# Patient Record
Sex: Female | Born: 1965 | Race: White | Hispanic: No | Marital: Married | Smoking: Former smoker
Health system: Southern US, Community
[De-identification: ages and names within clinical notes are randomized; demographics above are authoritative.]

## PROBLEM LIST (undated history)

## (undated) DIAGNOSIS — N871 Moderate cervical dysplasia: Secondary | ICD-10-CM

## (undated) DIAGNOSIS — J301 Allergic rhinitis due to pollen: Secondary | ICD-10-CM

## (undated) DIAGNOSIS — L2089 Other atopic dermatitis: Secondary | ICD-10-CM

## (undated) DIAGNOSIS — M549 Dorsalgia, unspecified: Secondary | ICD-10-CM

## (undated) DIAGNOSIS — M2550 Pain in unspecified joint: Secondary | ICD-10-CM

## (undated) HISTORY — PX: ENDOMETRIAL ABLATION: SHX621

## (undated) HISTORY — PX: LEFT OOPHORECTOMY: SHX1961

## (undated) HISTORY — DX: Moderate cervical dysplasia: N87.1

## (undated) HISTORY — DX: Allergic rhinitis due to pollen: J30.1

## (undated) HISTORY — DX: Other atopic dermatitis: L20.89

## (undated) HISTORY — PX: APPENDECTOMY: SHX54

## (undated) HISTORY — DX: Dorsalgia, unspecified: M54.9

## (undated) HISTORY — PX: OTHER SURGICAL HISTORY: SHX169

## (undated) HISTORY — DX: Pain in unspecified joint: M25.50

---

## 2005-02-28 ENCOUNTER — Ambulatory Visit: Payer: Self-pay | Admitting: Family Medicine

## 2005-02-28 ENCOUNTER — Other Ambulatory Visit: Admission: RE | Admit: 2005-02-28 | Discharge: 2005-02-28 | Payer: Self-pay | Admitting: Family Medicine

## 2005-08-09 ENCOUNTER — Ambulatory Visit: Payer: Self-pay | Admitting: Family Medicine

## 2005-08-26 ENCOUNTER — Ambulatory Visit: Payer: Self-pay | Admitting: Family Medicine

## 2005-12-03 ENCOUNTER — Ambulatory Visit: Payer: Self-pay | Admitting: Family Medicine

## 2005-12-03 DIAGNOSIS — J301 Allergic rhinitis due to pollen: Secondary | ICD-10-CM

## 2005-12-03 HISTORY — DX: Allergic rhinitis due to pollen: J30.1

## 2005-12-04 ENCOUNTER — Telehealth: Payer: Self-pay | Admitting: Family Medicine

## 2006-05-23 ENCOUNTER — Ambulatory Visit: Payer: Self-pay | Admitting: Family Medicine

## 2006-05-23 DIAGNOSIS — L209 Atopic dermatitis, unspecified: Secondary | ICD-10-CM

## 2006-05-23 DIAGNOSIS — L2089 Other atopic dermatitis: Secondary | ICD-10-CM

## 2006-05-23 HISTORY — DX: Other atopic dermatitis: L20.89

## 2006-09-11 ENCOUNTER — Other Ambulatory Visit: Admission: RE | Admit: 2006-09-11 | Discharge: 2006-09-11 | Payer: Self-pay | Admitting: Family Medicine

## 2006-09-11 ENCOUNTER — Ambulatory Visit: Payer: Self-pay | Admitting: Family Medicine

## 2006-09-11 ENCOUNTER — Encounter: Payer: Self-pay | Admitting: Family Medicine

## 2007-01-20 ENCOUNTER — Telehealth: Payer: Self-pay | Admitting: Family Medicine

## 2007-01-23 ENCOUNTER — Encounter: Payer: Self-pay | Admitting: Family Medicine

## 2007-01-23 LAB — CONVERTED CEMR LAB
ALT: <8 units/L (ref 0–35)
CO2: 24 meq/L (ref 19–32)
Calcium: 9.2 mg/dL (ref 8.4–10.5)
Chloride: 106 meq/L (ref 96–112)
Cholesterol: 166 mg/dL (ref 0–200)
Creatinine, Ser: 0.75 mg/dL (ref 0.40–1.20)
Glucose, Bld: 91 mg/dL (ref 70–99)
Total Bilirubin: 0.5 mg/dL (ref 0.3–1.2)
Total CHOL/HDL Ratio: 2.6
Triglycerides: 58 mg/dL (ref <150)

## 2007-01-26 ENCOUNTER — Encounter: Payer: Self-pay | Admitting: Family Medicine

## 2007-02-18 ENCOUNTER — Ambulatory Visit: Payer: Self-pay | Admitting: Obstetrics & Gynecology

## 2007-02-26 ENCOUNTER — Ambulatory Visit: Payer: Self-pay | Admitting: Obstetrics & Gynecology

## 2007-02-26 ENCOUNTER — Encounter: Admission: RE | Admit: 2007-02-26 | Discharge: 2007-02-26 | Payer: Self-pay | Admitting: Obstetrics & Gynecology

## 2007-03-11 ENCOUNTER — Ambulatory Visit: Payer: Self-pay | Admitting: Obstetrics & Gynecology

## 2007-04-10 ENCOUNTER — Ambulatory Visit (HOSPITAL_COMMUNITY): Admission: RE | Admit: 2007-04-10 | Discharge: 2007-04-10 | Payer: Self-pay | Admitting: Obstetrics & Gynecology

## 2007-04-10 ENCOUNTER — Ambulatory Visit: Payer: Self-pay | Admitting: Obstetrics & Gynecology

## 2007-04-10 ENCOUNTER — Encounter: Payer: Self-pay | Admitting: Obstetrics & Gynecology

## 2007-04-22 ENCOUNTER — Ambulatory Visit: Payer: Self-pay | Admitting: Obstetrics & Gynecology

## 2007-04-27 ENCOUNTER — Encounter: Admission: RE | Admit: 2007-04-27 | Discharge: 2007-04-27 | Payer: Self-pay | Admitting: Obstetrics & Gynecology

## 2007-07-17 ENCOUNTER — Ambulatory Visit: Payer: Self-pay | Admitting: Family Medicine

## 2007-07-17 DIAGNOSIS — L259 Unspecified contact dermatitis, unspecified cause: Secondary | ICD-10-CM | POA: Insufficient documentation

## 2007-07-17 DIAGNOSIS — L0100 Impetigo, unspecified: Secondary | ICD-10-CM | POA: Insufficient documentation

## 2007-07-21 ENCOUNTER — Telehealth: Payer: Self-pay | Admitting: Family Medicine

## 2007-09-10 ENCOUNTER — Telehealth: Payer: Self-pay | Admitting: Family Medicine

## 2007-12-09 ENCOUNTER — Telehealth: Payer: Self-pay | Admitting: Family Medicine

## 2008-04-08 ENCOUNTER — Ambulatory Visit: Payer: Self-pay | Admitting: Family Medicine

## 2008-04-27 ENCOUNTER — Ambulatory Visit: Payer: Self-pay | Admitting: Family Medicine

## 2008-04-28 LAB — CONVERTED CEMR LAB
Albumin: 4.4 g/dL (ref 3.5–5.2)
Alkaline Phosphatase: 72 units/L (ref 39–117)
BUN: 12 mg/dL (ref 6–23)
Calcium: 9.2 mg/dL (ref 8.4–10.5)
Chloride: 103 meq/L (ref 96–112)
Creatinine, Ser: 0.66 mg/dL (ref 0.40–1.20)
Glucose, Bld: 85 mg/dL (ref 70–99)
HDL: 57 mg/dL (ref >39)
Potassium: 5 meq/L (ref 3.5–5.3)
Total CHOL/HDL Ratio: 2.5
Triglycerides: 63 mg/dL (ref <150)

## 2009-08-18 ENCOUNTER — Ambulatory Visit: Payer: Self-pay | Admitting: Family Medicine

## 2009-08-20 LAB — CONVERTED CEMR LAB
Albumin: 4.4 g/dL (ref 3.5–5.2)
Alkaline Phosphatase: 74 units/L (ref 39–117)
BUN: 10 mg/dL (ref 6–23)
Creatinine, Ser: 0.62 mg/dL (ref 0.40–1.20)
Glucose, Bld: 88 mg/dL (ref 70–99)
HCT: 42.6 % (ref 36.0–46.0)
HDL: 58 mg/dL (ref >39)
Hemoglobin: 14 g/dL (ref 12.0–15.0)
LDL Cholesterol: 85 mg/dL (ref 0–99)
MCHC: 32.9 g/dL (ref 30.0–36.0)
MCV: 93 fL (ref 78.0–100.0)
RBC: 4.58 M/uL (ref 3.87–5.11)
Total Bilirubin: 0.6 mg/dL (ref 0.3–1.2)
Total CHOL/HDL Ratio: 2.7
Triglycerides: 56 mg/dL (ref <150)
VLDL: 11 mg/dL (ref 0–40)

## 2009-08-30 ENCOUNTER — Encounter: Payer: Self-pay | Admitting: Family Medicine

## 2010-02-22 NOTE — Letter (Signed)
Summary: [facility] Form/[facility]   Imported By: Lanelle Bal 09/12/2009 12:24:08  _____________________________________________________________________  External Attachment:    Type:   Image     Comment:   External Document

## 2010-02-22 NOTE — Assessment & Plan Note (Signed)
Summary: CPE   Vital Signs:  Patient profile:   45 year old female Height:      64.5 inches Weight:      145 pounds BMI:     24.59 Pulse rate:   77 / minute BP sitting:   129 / 83  (left arm) Cuff size:   regular  Vitals Entered By: Avon Gully CMA, Duncan Dull) (August 18, 2009 7:56 AM) CC: CPE, no pap, form for work   Primary Care Provider:  Nani Gasser MD  CC:  CPE, no pap, and form for work.  History of Present Illness: CPE, no pap, form for work.   Current Medications (verified): 1)  Halobetasol Propionate 0.05 % Oint (Halobetasol Propionate) .... Apply Once Daily To Affected Are For No More Than 2 Weeks At A Time.  Allergies (verified): 1)  ! * Lanolin 2)  Singulair (Montelukast Sodium)  Comments:  Nurse/Medical Assistant: The patient's medications and allergies were reviewed with the patient and were updated in the Medication and Allergy Lists. Avon Gully CMA, Duncan Dull) (August 18, 2009 8:04 AM)  Past History:  Past Medical History: Last updated: 04/27/2008 Wears glasses Dr. 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  Associates degree.  Married to [employer].  1 kids at home, 2 are from husbands first marriage (Jorja Loa, Catie, [place]).  Quit smoking 11/94, no EtOH, no drugs, 1-2 caffeinated drinks per day, works out.  Physical Exam  General:  Well-developed,well-nourished,in no acute distress; alert,appropriate and cooperative throughout examination Head:  Normocephalic and atraumatic without obvious abnormalities. No apparent alopecia or balding. Eyes:  No corneal or conjunctival inflammation noted. EOMI. Perrla. Wears glasses.  Sclera are injected.  Left eye turns  inward.  Ears:  External ear exam shows no significant lesions or deformities.  Otoscopic examination reveals clear canals, tympanic membranes are intact bilaterally without bulging, retraction, inflammation or discharge. Hearing is grossly normal bilaterally. Nose:  External nasal examination shows no deformity or inflammation. Nasal mucosa are pink and moist without lesions or exudates. Mouth:  Oral mucosa and oropharynx without lesions or exudates.  Teeth in good repair. Neck:  No deformities, masses, or tenderness noted. Chest Wall:  No deformities, masses, or tenderness noted. Lungs:  Normal respiratory effort, chest expands symmetrically. Lungs are clear to auscultation, no crackles or wheezes. Heart:  Normal rate and regular rhythm. S1 and S2 normal without gallop, murmur, click, rub or other extra sounds. Abdomen:  Bowel sounds positive,abdomen soft and non-tender without masses, organomegaly or hernias noted. Msk:  No deformity or scoliosis noted of thoracic or lumbar spine.   Pulses:  R and L carotid,radial,dorsalis pedis and posterior tibial pulses are full and equal bilaterally Extremities:  No clubbing, cyanosis, edema, or deformity noted with normal full range of motion of all joints.   Neurologic:  No cranial nerve deficits noted. Station and gait are normal.DTRs are symmetrical throughout. Sensory, motor and coordinative functions appear intact. Skin:  no rashes.   Cervical Nodes:  No lymphadenopathy noted Psych:  Cognition and judgment appear intact. Alert and cooperative with normal attention span and concentration. No apparent delusions, illusions, hallucinations   Impression & Recommendations:  Problem # 1:  HEALTHY ADULT FEMALE (ICD-V70.0) Doing well. Exam is normal Encouraged regular exercise.  continuer  her daily calcium.   Due for screening labs.  Has her gyn appt in October and Eye exam in a few weeks.  Orders: T-Comprehensive Metabolic Panel [phone]) T-Lipid  Profile [phone]) T- * Misc. Laboratory test [phone]) T-CBC No Diff ([id number])  Complete Medication List: 1)  Halobetasol Propionate 0.05 % Oint (Halobetasol propionate) .... Apply once daily to affected are for no more than 2 weeks at a time. 2)  Multivitamins Tabs (Multiple vitamin) 3)  Calcium-magnesium 500-250 Mg Tabs (Calcium-magnesium) .... Daily  Patient Instructions: 1)  Please schedule your mammogram . Call [phone]. 2)  It is important that you exercise reguarly at least 20 minutes 5 times a week. If you develop chest pain, have severe difficulty breathing, or feel very tired, stop exercising immediately and seek medical attention.

## 2010-06-05 NOTE — Assessment & Plan Note (Signed)
NAME:  Anna Ray, Anna Ray              ACCOUNT NO.:  [id number]   MEDICAL RECORD NO.:  [id number]          PATIENT TYPE:  POB   LOCATION:  [facility] at [place]         FACILITY:  [facility]   PHYSICIAN:  Elsie Lincoln, MD      DATE OF BIRTH:  1966/01/17   DATE OF SERVICE:                                  CLINIC NOTE   The patient is a 45 year old female who presents for endometrial biopsy  and labial biopsy.  She has had abnormal uterine bleeding and also it  looks like she has a polyp on ultrasound.  The patient also has an  ablated upper portion of the labia minora where her labia minora are  fused at the level of her urethra.  The patient has been consented to  both and her urine pregnancy tests are negative.   PROCEDURE:  1. Endometrial biopsy.  The patient is known to have an anteverted      uterus.  The patient was posed in dorsal lithotomy position and      prepared and draped in normal sterile fashion.  The cervix was      cleaned with Betadine and the anterior lip of the cervix was      expressed equal to tenaculum.  Endometrial biopsy was done and      uterus sounded to 8 cm.  Two passes were taken.  Good tissue was      noted.  Patient tolerated procedure well.  2. Labial biopsy.  The labia was cleaned with Betadine and 3 cc of 1%      Lidocaine were injected into the area.  A small tissue biopsy was      taken with scalpel.  There was good hemostasis with the aid of      silver nitrate.   ASSESSMENT/PLAN:  [age]-year-old female with abnormal uterine bleeding  and labial atrophy plus obliteration.  1. Endometrial biopsy done.  We will review results and call patient.      The patient will need to have her polyp removed too in order to      have the abnormal uterine bleeding stopped.  We will take her to      the operating room for this and do a D&C hysteroscopy with      hydrotherm ablation.  Patient understands the risks of the      procedure are bleeding, infection, damage to  the back of uterus and      burning of the vagina.  2. Labial atrophy.  The patient __________  We will treat her      accordingly.  3. Patient will be scheduled for surgery as above.           ______________________________  Elsie Lincoln, MD     KL/MEDQ  D:  03/11/2007  T:  03/11/2007  Job:  [id number]

## 2010-06-05 NOTE — Assessment & Plan Note (Signed)
NAME:  Anna Ray, Anna Ray              ACCOUNT NO.:  [id number]   MEDICAL RECORD NO.:  [id number]          PATIENT TYPE:  POB   LOCATION:  [facility] at [place]         FACILITY:  [facility]   PHYSICIAN:  Elsie Lincoln, MD      DATE OF BIRTH:  [birth year]   DATE OF SERVICE:  04/22/2007                                  CLINIC NOTE   The patient is a 45 year old female who presents for followup of a D&C  hysteroscopy and endometrial ablation. The patient's pathology came back  as polypoid fragments of benign secretory type endometrium with no  evidence of hyperplasia or malignancy. The patient had an uneventful  endometrial ablation with the [facility] Ablation. She  did have some polypoid fragments at the endocervical-uterine junction.  She has been doing very well since. She is having the normal watery  discharge. She has not been bleeding. She can resume sexual activity.  Her Pap smear is up to date and due in August 2009. She also needs a  mammogram, and we will order that. She had some labial atrophy and used  steroid cream but is no longer having any issues from that. Her biopsy  from the labia was normal. The patient is to come back in August for a  yearly exam.           ______________________________  Elsie Lincoln, MD     KL/MEDQ  D:  04/22/2007  T:  04/22/2007  Job:  [id number]

## 2010-06-05 NOTE — Op Note (Signed)
Anna Ray, Anna Ray              ACCOUNT NO.:  [id number]   MEDICAL RECORD NO.:  [id number]          PATIENT TYPE:  AMB   LOCATION:  [facility]                           FACILITY:  [facility]   PHYSICIAN:  Lesly Dukes, M.D. DATE OF BIRTH:  03-12-1965   DATE OF PROCEDURE:  04/10/2007  DATE OF DISCHARGE:                               OPERATIVE REPORT   PREOPERATIVE DIAGNOSIS:  A 45 year old female with abnormal uterine  bleeding unresponsive to medication.   POSTOPERATIVE DIAGNOSIS:  A 45 year old female with abnormal uterine  bleeding unresponsive to medication.   OPERATIONS:  D and C, hysteroscopy, Hydrotherm ablation.   SURGEON:  Lesly Dukes, M.D.   ANESTHESIA:  General.   FINDINGS:  Uterus with thickened endometrium, especially at the  endocervical uterine junction with some polypoid fleshy material.   SPECIMENS:  Endometrial curettings to pathology.   ESTIMATED BLOOD LOSS:  Minimal.   COMPLICATIONS:  None.   DESCRIPTION OF PROCEDURE:  After informed consent was obtained, the  patient was taken to the operating room where general anesthesia was  induced.  The patient was placed in the dorsal lithotomy position,  prepared and draped in the normal sterile fashion.  The bladder was  emptied with the catheter.  A bivalve speculum was placed into the  vagina and the cervix was brought into view.  The anterior lip of the  cervix was grasped with the single-tooth tenaculum.  The cervical os was  gently dilated with dilators to a #8.  The hysteroscope was introduced  and hysteroscopy was performed with the above findings.  The  hysteroscope was removed and a D and C was performed.  The hysteroscope  was reintroduced and the uterine cavity was filled according to  manufacturer's specifications and a Hydrotherm ablation was completed  for 10 minutes with the appropriate cool down period.  At the end of the  procedure, it appeared all the endometrium was charred down to the  uterine  cervical junction.  There was no bleeding at the end of the  case.  The patient tolerated the procedure well.  Sponge, lap,  instrument and needle count were correct x2 and the patient went to the  recovery room in stable condition.      Lesly Dukes, M.D.  Electronically Signed    KHL/MEDQ  D:  04/10/2007  T:  04/10/2007  Job:  [id number]

## 2010-06-05 NOTE — Assessment & Plan Note (Signed)
NAME:  KAREEM, AUL              ACCOUNT NO.:  [id number]   MEDICAL RECORD NO.:  [id number]          PATIENT TYPE:  POB   LOCATION:  [facility] [place]         FACILITY:  [facility]   PHYSICIAN:  Elsie Lincoln, MD      DATE OF BIRTH:  12/29/1965   DATE OF SERVICE:                                  CLINIC NOTE   Patient is a 45 year old female who is self-referred, worried about  irregular bleeding and menopause.  The patient has had irregular periods  her whole life in terms of consistency but not in timing.  She has a  period every month.  Sometimes it is heavy with clots.  Other times it  is just spotting.  She does have irritability, breast tenderness, and  sometimes headaches associated with all the periods.  She also does have  spotting in between the periods for the past few months prior to  starting birth control pills in August.  She was started on birth  control pills, Loestrin 120, by [facility].  She has  continued to have irregular bleeding with the last episode lasting 01/04  to 01/06 spotting.  She is not having hot flashes or flushes.  She does  have occasional vaginal irritation.   PAST MEDICAL HISTORY:  Eczema.   PAST SURGICAL HISTORY:  1. Laparotomy 16 years ago for a large ovarian cyst that was benign.  2. Appendectomy.   FAMILY HISTORY:  Negative for breast, ovarian, uterine, or colon  cancers.   SOCIAL HISTORY:  Negative for current drinking, drugs, or alcohol but  has a history of all.  Patient does work for [employer].   REVIEW OF SYSTEMS:  Positive for recurrent headaches.  However, she has  not had a headache in the past month-and-a-half.   MEDICATIONS:  Elidel.  Loestrin.   ALLERGIES:  1. LANOLIN.  2. SINGULAIR.   PHYSICAL EXAMINATION:  VITAL SIGNS:  Temperature 98.4.  Pulse 82.  Blood  pressure 107/74.  Weight 141.  Height 63-3/4 inches.  GENERAL:  Well-nourished, well-developed, in no apparent distress.  NEURO:  Alert and  oriented x3.  HEENT:  Normocephalic.  Atraumatic.  RESPIRATORY:  Breathing nonlabored.  ABDOMEN:  Soft.  Nontender.  Nondistended.  No organomegaly.  No hernia.  GENITALIA:  Tanner 5 with what appears to be some agglutination of the  labia minora with some obliteration, questionably lichen sclerosis  superior to the urethra.  Vagina is slightly atrophic.  Uterus grade 1  prolapse, nontender, mobile adnexa.  No ovaries palpated at either side  and no masses.  RECTUM:  No lesions.  EXTREMITIES:  Nontender.  No edema.   ASSESSMENT/PLAN:  45 year old female with irregular bleeding.   PLAN:  1. We need to check a TSH.  She has no nipple discharge so prolactin      not needed.  2. Endometrial biopsy is warranted as she has had an extended period      of irregular bleeding and spotting in between periods.  3. Transvaginal ultrasound.  4. Labial biopsy to rule out lichen sclerosis.  5. I do not think that a follicle-stimulating hormone is warranted at  this time as the patient has not skipped any periods.           ______________________________  Elsie Lincoln, MD     KL/MEDQ  D:  02/18/2007  T:  02/18/2007  Job:  [id number]

## 2010-10-15 LAB — CBC
HCT: 41.9
MCV: 91.4
Platelets: 280
WBC: 7.6

## 2010-12-12 ENCOUNTER — Other Ambulatory Visit: Payer: Self-pay | Admitting: Family Medicine

## 2011-01-08 ENCOUNTER — Encounter: Payer: Self-pay | Admitting: Family Medicine

## 2011-01-11 ENCOUNTER — Encounter: Payer: Self-pay | Admitting: Family Medicine

## 2011-01-11 ENCOUNTER — Ambulatory Visit (INDEPENDENT_AMBULATORY_CARE_PROVIDER_SITE_OTHER): Payer: BC Managed Care – PPO | Admitting: Family Medicine

## 2011-01-11 VITALS — BP 128/85 | HR 84 | Temp 97.7°F | Ht 64.5 in | Wt 140.0 lb

## 2011-01-11 DIAGNOSIS — I8390 Asymptomatic varicose veins of unspecified lower extremity: Secondary | ICD-10-CM

## 2011-01-11 DIAGNOSIS — I868 Varicose veins of other specified sites: Secondary | ICD-10-CM

## 2011-01-11 DIAGNOSIS — Z0000 Encounter for general adult medical examination without abnormal findings: Secondary | ICD-10-CM

## 2011-01-11 DIAGNOSIS — J329 Chronic sinusitis, unspecified: Secondary | ICD-10-CM

## 2011-01-11 DIAGNOSIS — M21619 Bunion of unspecified foot: Secondary | ICD-10-CM

## 2011-01-11 LAB — LIPID PANEL
HDL: 49 mg/dL (ref >39)
LDL Cholesterol: 76 mg/dL (ref 0–99)
Triglycerides: 71 mg/dL (ref <150)

## 2011-01-11 MED ORDER — AMOXICILLIN 875 MG PO TABS: 875.0000 mg | ORAL_TABLET | Freq: Two times a day (BID) | ORAL | Status: AC

## 2011-01-11 NOTE — Patient Instructions (Addendum)
Start a regular exercise program and make sure you are eating a healthy diet Try to eat 4 servings of dairy a day or take a calcium supplement (500mg  twice a day). Your vaccines are up to date.       Sinusitis Sinuses are air pockets within the bones of your face. The growth of bacteria within a sinus leads to infection. The infection prevents the sinuses from draining. This infection is called sinusitis. SYMPTOMS   There will be different areas of pain depending on which sinuses have become infected.  The maxillary sinuses often produce pain beneath the eyes.     Frontal sinusitis may cause pain in the middle of the forehead and above the eyes.  Other problems (symptoms) include:  Toothaches.     Colored, pus-like (purulent) drainage from the nose.     Swelling, warmth, and tenderness over the sinus areas may be signs of infection.  TREATMENT   Sinusitis is most often determined by an exam.X-rays may be taken. If x-rays have been taken, make sure you obtain your results or find out how you are to obtain them. Your caregiver may give you medications (antibiotics). These are medications that will help kill the bacteria causing the infection. You may also be given a medication (decongestant) that helps to reduce sinus swelling.   HOME CARE INSTRUCTIONS    Only take over-the-counter or prescription medicines for pain, discomfort, or fever as directed by your caregiver.     Drink extra fluids. Fluids help thin the mucus so your sinuses can drain more easily.     Applying either moist heat or ice packs to the sinus areas may help relieve discomfort.     Use saline nasal sprays to help moisten your sinuses. The sprays can be found at your local drugstore.  SEEK IMMEDIATE MEDICAL CARE IF:  You have a fever.     You have increasing pain, severe headaches, or toothache.     You have nausea, vomiting, or drowsiness.     You develop unusual swelling around the face or trouble seeing.    MAKE SURE YOU:    Understand these instructions.     Will watch your condition.     Will get help right away if you are not doing well or get worse.  Document Released: 01/07/2005 Document Revised: 09/19/2010 Document Reviewed: 08/06/2006 [facility] Patient Information 2012 [place], [place].

## 2011-01-11 NOTE — Progress Notes (Signed)
Subjective:     Anna Ray is a 45 y.o. female and is here for a comprehensive physical exam. The patient reports problems - 1 week of fever, chills, shakes, sweats, nausea. Never vomited. No diarrhea.  Mild cough, milld ST, sinsus pressure, teeth are hurting.  No nasal congestion ut pressure under her eyes.  Lots of postnasal drip. + HA.  Marland Kitchen  Also c/o of  Ear pain at the top of the right ear over the cartilage. Comes and goes for over a year. Say it will be a very intense sharp pain. No redenss or swelling. No inner ear pain or problen=ns,   History   Social History  . Marital Status: Married    Spouse Name: tim    Number of Children: 1  . Years of Education: N/A   Occupational History  . Not on file.   Social History Main Topics  . Smoking status: Former Smoker    Quit date: 11/21/1992  . Smokeless tobacco: Not on file  . Alcohol Use: No  . Drug Use: No  . Sexually Active: Yes -- Female partner(s)   Other Topics Concern  . Not on file   Social History Narrative   2 cups of caffeine. Regular exercise.     Health Maintenance  Topic Date Due  . Pap Smear  02/10/1985  . Influenza Vaccine  10/22/2011  . Tetanus/tdap  03/21/2012    The following portions of the patient's history were reviewed and updated as appropriate: allergies, current medications, past family history, past medical history, past social history, past surgical history and problem list.  Review of Systems A comprehensive review of systems was negative.   Objective:    BP 128/85  Pulse 84  Temp(Src) 97.7 F (36.5 C) (Oral)  Ht 5' 4.5" (1.638 m)  Wt 140 lb (63.504 kg)  BMI 23.66 kg/m2 General appearance: alert, cooperative and appears stated age Head: Normocephalic, without obvious abnormality, atraumatic Eyes: conj clear, EOMi, PEERLA Ears: normal TM's and external ear canals both ears. Normal external ear exam. No redness, tenderness or swelling. There is some mild eczema at the lower ear crease.    Nose: Nares normal. Septum midline. Mucosa normal. No drainage or sinus tenderness. Throat: lips, mucosa, and tongue normal; teeth and gums normal Neck: no adenopathy, no carotid bruit, no JVD, supple, symmetrical, trachea midline and thyroid not enlarged, symmetric, no tenderness/mass/nodules Back: symmetric, no curvature. ROM normal. No CVA tenderness. Lungs: clear to auscultation bilaterally Heart: regular rate and rhythm, S1, S2 normal, no murmur, click, rub or gallop Abdomen: soft, non-tender; bowel sounds normal; no masses,  no organomegaly Extremities: extremities normal, atraumatic, no cyanosis or edema. She has small bunionette on the lateral foot. No erythema or swelling. No other toe abnormality.  Pulses: 2+ and symmetric Skin: Skin color, texture, turgor normal. No rashes or lesions. She has spider veins on her upper right thigh. No bulging varicose veins.  Lymph nodes: Cervical, supraclavicular, and axillary nodes normal. Neurologic: Alert and oriented X 3, normal strength and tone. Normal symmetric reflexes. Normal coordination and gait    Assessment:    Healthy female exam.    Sinusitis  Bunionette Spider veins   Plan:    See After Visit Summary for Counseling Recommendations  Start a regular exercise program and make sure you are eating a healthy diet Try to eat 4 servings of dairy a day or take a calcium supplement (500mg  twice a day). Your vaccines are up to date.  She has questions about the bunions. They are very painful and even hurt after she takes her shoes off. She also thinks has plantar fascitis. Says her heels often feel tender. Very painful in the AM when first gets out of bed. Says they almost feel swollen but when looks at her foot it looks normal.  Referall to podiatry for bunions.    She has questions about her varicose veins as well.  She does have spider veins on her upper thighs. We discussed wearing compression stocking to help prevent future veins.  For her current veins she say they are not painful or bothersome so removal would be purely cosmetic. She could see a dermatogist who does laser therapy or vein and vascular clinic in [place] or [place].    Sinusitis - will tx with ABX.symptomatic care.  If not better in one week then let me know.

## 2011-01-12 LAB — COMPLETE METABOLIC PANEL WITH GFR
ALT: 11 U/L (ref 0–35)
CO2: 25 mEq/L (ref 19–32)
Creat: 0.6 mg/dL (ref 0.50–1.10)
GFR, Est African American: >89 mL/min
GFR, Est Non African American: >89 mL/min
Total Bilirubin: 0.4 mg/dL (ref 0.3–1.2)

## 2011-07-15 ENCOUNTER — Ambulatory Visit (INDEPENDENT_AMBULATORY_CARE_PROVIDER_SITE_OTHER): Payer: BC Managed Care – PPO | Admitting: Physician Assistant

## 2011-07-15 ENCOUNTER — Encounter: Payer: Self-pay | Admitting: Physician Assistant

## 2011-07-15 VITALS — BP 114/79 | HR 83 | Temp 98.3°F | Ht 63.75 in | Wt 139.0 lb

## 2011-07-15 DIAGNOSIS — M545 Low back pain: Secondary | ICD-10-CM

## 2011-07-15 DIAGNOSIS — L738 Other specified follicular disorders: Secondary | ICD-10-CM

## 2011-07-15 DIAGNOSIS — L739 Follicular disorder, unspecified: Secondary | ICD-10-CM

## 2011-07-15 DIAGNOSIS — R21 Rash and other nonspecific skin eruption: Secondary | ICD-10-CM

## 2011-07-15 MED ORDER — CEPHALEXIN 500 MG PO CAPS: 500.0000 mg | ORAL_CAPSULE | Freq: Four times a day (QID) | ORAL | Status: AC

## 2011-07-15 MED ORDER — CYCLOBENZAPRINE HCL 5 MG PO TABS: 5.0000 mg | ORAL_TABLET | Freq: Every evening | ORAL | Status: DC | PRN

## 2011-07-15 MED ORDER — MUPIROCIN 2 % EX OINT: TOPICAL_OINTMENT | Freq: Three times a day (TID) | CUTANEOUS | Status: AC

## 2011-07-15 MED ORDER — METHYLPREDNISOLONE ACETATE 80 MG/ML IJ SUSP
80.0000 mg | Freq: Once | INTRAMUSCULAR | Status: AC
  Administered 2011-07-15: 80 mg via INTRAMUSCULAR

## 2011-07-15 NOTE — Progress Notes (Signed)
  Subjective:    Patient ID: Anna Ray, female    DOB: March 21, 1965, 46 y.o.   MRN: [id number]  HPI Patient presents today with a rash it start when she went out of town the last week of may. She first noticed the red bump on her chest. She used her Ultravate cream because she assumed it was eczema. He initially went away and then it started spreading all over her torso and down her legs. It itches a lot but no pain. No drainage of lesions. NO fever, chills, myalgias. She did have a staph infection 25 years ago that presented similarly.  Patient just started having some low back pain. The pain does not radiate anywhere. Pt works out a lot and the pain is worse when she is very active. She has not tried anything to make better. She has never had imaging done on back.   Review of Systems     Objective:   Physical Exam  Constitutional: She is oriented to person, place, and time. She appears well-developed and well-nourished.  HENT:  Head: Normocephalic and atraumatic.  Musculoskeletal: Normal range of motion.       Tenderness to palpation on right side of spine in lumbar region. No bony tenderness. Normal ROM at waist.  Neurological: She is alert and oriented to person, place, and time.  Skin:       Red papules closed with no drainage some scabbed over and other look like top has been scratched off. NO vesicles to plaques. Located on torso(front and back), legs, arms, chest.  Psychiatric: She has a normal mood and affect. Her behavior is normal.          Assessment & Plan:  RAsh/Skin eruption/Folliculitis- Gave depo for itching and if there is a contact dermititis component. stop Ultravae. I gave Keflex to treat for bacteria infection. There were no lesion draining that I could culture. I also gave Bactroban to [place] TID for open lesion.   Low back pain- Gave stretches to do. Gave rx for flexeril to use at night. Can use motril for pain control. Call office if not improving.

## 2011-07-15 NOTE — Patient Instructions (Addendum)
Keflex sent to pharmacy to take four times a day for 10 days. Bactroban given to use topically three times a day for 1 week.   Flexeril was given for low back pain to use at needed at night. Also consider anti-inflammatories to help with pain. Stretch well before exercising.  Folliculitis  Folliculitis is an infection and inflammation of the hair follicles. Hair follicles become red and irritated. This inflammation is usually caused by bacteria. The bacteria thrive in warm, moist environments. This condition can be seen anywhere on the body.  CAUSES The most common cause of folliculitis is an infection by germs (bacteria). Fungal and viral infections can also cause the condition. Viral infections may be more common in people whose bodies are unable to fight disease well (weakened immune systems). Examples include people with:  AIDS.   An organ transplant.   Cancer.  People with depressed immune systems, diabetes, or obesity, have a greater risk of getting folliculitis than the general population. Certain chemicals, especially oils and tars, also can cause folliculitis. SYMPTOMS  An early sign of folliculitis is a small, white or yellow pus-filled, itchy lesion (pustule). These lesions appear on a red, inflamed follicle. They are usually less than 5 mm (.20 inches).   The most likely starting points are the scalp, thighs, legs, back and buttocks. Folliculitis is also frequently found in areas of repeated shaving.   When an infection of the follicle goes deeper, it becomes a boil or furuncle. A group of closely packed boils create a larger lesion (a carbuncle). These sores (lesions) tend to occur in hairy, sweaty areas of the body.  TREATMENT   A doctor who specializes in skin problems (dermatologists) treats mild cases of folliculitis with antiseptic washes.   They also use a skin application which kills germs (topical antibiotics). Tea tree oil is a good topical antiseptic as well. It can  be found at a health food store. A small percentage of individuals may develop an allergy to the tea tree oil.   Mild to moderate boils respond well to warm water compresses applied three times daily.   In some cases, oral antibiotics should be taken with the skin treatment.   If lesions contain large quantities of pus or fluid, your caregiver may drain them. This allows the topical antibiotics to get to the affected areas better.   Stubborn cases of folliculitis may respond to laser hair removal. This process uses a high intensity light beam (a laser) to destroy the follicle and reduces the scarring from folliculitis. After laser hair removal, hair will no longer grow in the laser treated area.  Patients with long-lasting folliculitis need to find out where the infection is coming from. Germs can live in the nostrils of the patient. This can trigger an outbreak now and then. Sometimes the bacteria live in the nostrils of a family member. This person does not develop the disorder but they repeatedly re-expose others to the germ. To break the cycle of recurrence in the patient, the family member must also undergo treatment. PREVENTION   Individuals who are predisposed to folliculitis should be extremely careful about personal hygiene.   Application of antiseptic washes may help prevent recurrences.   A topical antibiotic cream, mupirocin (Bactroban), has been effective at reducing bacteria in the nostrils. It is applied inside the nose with your little finger. This is done twice daily for a week. Then it is repeated every 6 months.   Because follicle disorders tend to come  back, patients must receive follow-up care. Your caregiver may be able to recognize a recurrence before it becomes severe.  SEEK IMMEDIATE MEDICAL CARE IF:   You develop redness, swelling, or increasing pain in the area.   You have a fever.   You are not improving with treatment or are getting worse.   You have any other  questions or concerns.  Document Released: 03/18/2001 Document Revised: 12/27/2010 Document Reviewed: 01/13/2008 [facility] Patient Information 2012 [place], [place].

## 2011-10-11 ENCOUNTER — Other Ambulatory Visit: Payer: Self-pay | Admitting: Family Medicine

## 2011-10-11 DIAGNOSIS — Z1231 Encounter for screening mammogram for malignant neoplasm of breast: Secondary | ICD-10-CM

## 2011-10-16 ENCOUNTER — Encounter: Payer: Self-pay | Admitting: Obstetrics & Gynecology

## 2011-10-16 ENCOUNTER — Ambulatory Visit (INDEPENDENT_AMBULATORY_CARE_PROVIDER_SITE_OTHER): Payer: BC Managed Care – PPO | Admitting: Obstetrics & Gynecology

## 2011-10-16 VITALS — BP 102/66 | HR 82 | Temp 97.4°F | Resp 16 | Ht 64.0 in | Wt 136.0 lb

## 2011-10-16 DIAGNOSIS — Z01419 Encounter for gynecological examination (general) (routine) without abnormal findings: Secondary | ICD-10-CM

## 2011-10-16 DIAGNOSIS — N951 Menopausal and female climacteric states: Secondary | ICD-10-CM

## 2011-10-16 DIAGNOSIS — Z124 Encounter for screening for malignant neoplasm of cervix: Secondary | ICD-10-CM

## 2011-10-16 DIAGNOSIS — Z0000 Encounter for general adult medical examination without abnormal findings: Secondary | ICD-10-CM

## 2011-10-16 DIAGNOSIS — Z1151 Encounter for screening for human papillomavirus (HPV): Secondary | ICD-10-CM

## 2011-10-16 NOTE — Patient Instructions (Signed)
Place premenopausal annual exam patient instructions here.  °

## 2011-10-16 NOTE — Progress Notes (Signed)
  Subjective:     Anna Ray is a 46 y.o. female here for a routine exam.  Current complaints: none.  Personal health questionnaire reviewed: yes.  Pt has hot flashes now.  Pt has occasional period but not as regular anymore.  Pt does have premenopausal symptoms before she does bleed.   Gynecologic History Patient's last menstrual period was 09/25/2011. Contraception: vasectomy Last Pap: 2009. Results were: normal Last mammogram: 2009. Results were: normal  Obstetric History OB History    Grav Para Term Preterm Abortions TAB SAB Ect Mult Living   1 1 1             # Outc Date GA Lbr Len/2nd Wgt Sex Del Anes PTL Lv   1 TRM                The following portions of the patient's history were reviewed and updated as appropriate: allergies, current medications, past family history, past medical history, past social history, past surgical history and problem list.  Review of Systems A comprehensive review of systems was negative.    Objective:    Vitals:  WNL General appearance: alert, cooperative and no distress Head: Normocephalic, without obvious abnormality, atraumatic Eyes: negative Throat: lips, mucosa, and tongue normal; teeth and gums normal Lungs: clear to auscultation bilaterally Breasts: normal appearance, no masses or tenderness, No nipple retraction or dimpling, No nipple discharge or bleeding Heart: regular rate and rhythm Abdomen: soft, non-tender; bowel sounds normal; no masses,  no organomegaly Pelvic: cervix normal in appearance, external genitalia normal, no adnexal masses or tenderness, no bladder tenderness, no cervical motion tenderness, perianal skin: no external genital warts noted, rectovaginal septum normal, urethra without abnormality or discharge, uterus normal size, shape, and consistency and vagina normal without discharge Extremities: no edema, redness or tenderness in the calves or thighs Skin: no lesions or rash Lymph nodes: Axillary adenopathy:  none       Assessment:    Healthy female exam.    Plan:    Education reviewed: self breast exams and skin cancer screening. Contraception: vasectomy. Mammogram ordered. Follow up in: 1 year. FSH to see if menopausal Pt to notify us if periods become more irregular or heavy   (Mammogram was ordered by dr. Darra Lis and pt has appointment)

## 2011-10-17 ENCOUNTER — Telehealth: Payer: Self-pay | Admitting: *Deleted

## 2011-10-17 NOTE — Telephone Encounter (Signed)
Message copied by Granville Lewis on Thu Oct 17, 2011 11:00 AM ------      Message from: Lesly Dukes      Created: Thu Oct 17, 2011 10:22 AM       Pt has menopausal level of FSH.  RN to notify pt with results.  This explains hot flashes and irregular cycles.

## 2011-10-17 NOTE — Telephone Encounter (Signed)
Pt notified of FSH levels of being menopausal.  She will contact us if she becomes so symptomatic that she may need assistance.

## 2011-11-19 ENCOUNTER — Ambulatory Visit (INDEPENDENT_AMBULATORY_CARE_PROVIDER_SITE_OTHER): Payer: BC Managed Care – PPO

## 2011-11-19 DIAGNOSIS — Z1231 Encounter for screening mammogram for malignant neoplasm of breast: Secondary | ICD-10-CM

## 2012-02-18 ENCOUNTER — Ambulatory Visit (INDEPENDENT_AMBULATORY_CARE_PROVIDER_SITE_OTHER): Payer: BC Managed Care – PPO | Admitting: Family Medicine

## 2012-02-18 ENCOUNTER — Encounter: Payer: Self-pay | Admitting: Family Medicine

## 2012-02-18 VITALS — BP 113/73 | HR 68 | Temp 98.1°F | Wt 140.0 lb

## 2012-02-18 DIAGNOSIS — J209 Acute bronchitis, unspecified: Secondary | ICD-10-CM

## 2012-02-18 DIAGNOSIS — B9689 Other specified bacterial agents as the cause of diseases classified elsewhere: Secondary | ICD-10-CM

## 2012-02-18 DIAGNOSIS — A499 Bacterial infection, unspecified: Secondary | ICD-10-CM

## 2012-02-18 MED ORDER — AZITHROMYCIN 250 MG PO TABS: ORAL_TABLET | ORAL | Status: AC

## 2012-02-18 MED ORDER — HYDROCODONE-HOMATROPINE 5-1.5 MG/5ML PO SYRP: 5.0000 mL | ORAL_SOLUTION | Freq: Four times a day (QID) | ORAL | Status: DC | PRN

## 2012-02-18 NOTE — Progress Notes (Signed)
CC: Anna Ray is a 47 y.o. female is here for Cough and Sore Throat   Subjective: HPI:  Patient reports cough. Described as nonproductive. Started mid last week getting worse and daily basis. Associated with fevers of 100.3 on a daily basis, response to ibuprofen. Cough is not improved with over-the-counter Robitussin. Symptoms present 24 hours a day and interfering with sleep. Describes overall severity of moderate. Mild shortness of breath when lying down flat but none otherwise. Symptoms improved slightly when sitting up at night. Also reports subjective chills over the weekend. Denies headache, facial pain, nasal congestion, hearing loss, dizziness, confusion, ear pain, joint pain, muscle pain, abdominal pain, nausea, exertional chest pain.   Review Of Systems Outlined In HPI  Past Medical History  Diagnosis Date  . ALLERGIC RHINITIS, SEASONAL 12/03/2005    Qualifier: Diagnosis of  By: Linford Arnold MD, Santina Evans    . DERMATITIS, OTHER ATOPIC 05/23/2006    Qualifier: Diagnosis of  By: Linford Arnold MD, Aurora Mask History  Problem Relation Age of Onset  . Alcohol abuse Other   . Heart attack Father 67  . Alcohol abuse Father   . Hyperlipidemia Father   . Hyperlipidemia Mother   . Hypertension Mother   . Hypertension Father   . Diabetes Maternal Aunt      History  Substance Use Topics  . Smoking status: Former Smoker    Quit date: 11/21/1992  . Smokeless tobacco: Not on file  . Alcohol Use: No     Objective: Filed Vitals:   02/18/12 0909  BP: 113/73  Pulse: 68  Temp: 98.1 F (36.7 C)    General: Alert and Oriented, No Acute Distress HEENT: Pupils equal, round, reactive to light. Conjunctivae clear.  External ears unremarkable, canals clear with intact TMs with appropriate landmarks.  Middle ear appears open without effusion. Pink inferior turbinates.  Moist mucous membranes, pharynx without inflammation nor lesions.  Neck supple without palpable  lymphadenopathy nor abnormal masses. Lungs: Comfortable work of breathing, frequent coughing during exam, no wheezing/rales/consolidations on auscultation. Mild central rhonchi Cardiac: Regular rate and rhythm. Normal S1/S2.  No murmurs, rubs, nor gallops.   Extremities: No peripheral edema.   Mental Status: No depression, anxiety, nor agitation. Skin: Warm and dry.  Assessment & Plan: Anna Ray was seen today for cough and sore throat.  Diagnoses and associated orders for this visit:  Acute bacterial bronchitis - azithromycin (ZITHROMAX) 250 MG tablet; Take two tabs at once on day 1, then one tab daily on days 2-5. - HYDROcodone-homatropine (HYCODAN) 5-1.5 MG/5ML syrup; Take 5 mLs by mouth every 6 (six) hours as needed for cough.    Bacterial bronchitis: Start azithromycin, Hycodan as needed for sleep, continue using Robitussin during the daytime. Focus on staying hydrated and consider humidifier in the room at night.Signs and symptoms requring emergent/urgent reevaluation were discussed with the patient.  Return if symptoms worsen or fail to improve.

## 2012-05-19 ENCOUNTER — Encounter: Payer: Self-pay | Admitting: Obstetrics & Gynecology

## 2012-05-19 ENCOUNTER — Ambulatory Visit (INDEPENDENT_AMBULATORY_CARE_PROVIDER_SITE_OTHER): Payer: BC Managed Care – PPO | Admitting: Obstetrics & Gynecology

## 2012-05-19 VITALS — BP 123/82 | HR 71 | Resp 16 | Ht 63.75 in | Wt 135.0 lb

## 2012-05-19 DIAGNOSIS — N951 Menopausal and female climacteric states: Secondary | ICD-10-CM

## 2012-05-19 DIAGNOSIS — R5383 Other fatigue: Secondary | ICD-10-CM

## 2012-05-19 MED ORDER — CONJ ESTROG-MEDROXYPROGEST ACE 0.625-2.5 MG PO TABS: 1.0000 | ORAL_TABLET | Freq: Every day | ORAL | Status: DC

## 2012-05-19 NOTE — Progress Notes (Signed)
  Subjective:    Patient ID: Anna Ray, female    DOB: 1965-06-06, 47 y.o.   MRN: [id number]  HPI  Pt presents complaining of fatigue, decreased libido for several months.  Pt's FSH is 83 in 2013.   She had a scant period in September and then in Jan 2014.  Pt has had hot flashes, too, but they are not worse than before.  Pt denies depression, problems sleeping, chest pain.  Pt had cardiac work up several years ago.  Pt has no change in eating habits, no weight gain.  Review of Systems  Constitutional: Positive for fatigue. Negative for unexpected weight change.  Eyes: Positive for itching.  Respiratory: Negative.   Cardiovascular: Negative.   Gastrointestinal: Negative.   Endocrine: Negative for cold intolerance.  Genitourinary: Negative for vaginal bleeding and pelvic pain.  Psychiatric/Behavioral: Negative.        Objective:   Physical Exam  Vitals reviewed. Constitutional: She appears well-developed and well-nourished.  Pulmonary/Chest: Effort normal.  Neurological:  Pt appears very tired and was dosing off during conversation.  Skin: Skin is warm and dry.  Psychiatric: She has a normal mood and affect.          Assessment & Plan:  47 yo female with fatigue, decrased libido and hot flashes perimenopausal to menpause  1-TSH, Vit D (PT had recent CBC at work which she states in [place]) 2-Pt to try prempro for symptoms.  If not better, pt should see primary care to rule out sleep apnea or other disorder, atypical presentation for heart disease, or other medical abnormality.   3- Pt aware of increased risk of breast cancer, clots/ PE with HRT

## 2012-05-20 ENCOUNTER — Telehealth: Payer: Self-pay | Admitting: *Deleted

## 2012-05-20 NOTE — Telephone Encounter (Signed)
LM on voicemail that her labs were WNL.

## 2012-06-04 ENCOUNTER — Telehealth: Payer: Self-pay | Admitting: *Deleted

## 2012-06-04 DIAGNOSIS — N951 Menopausal and female climacteric states: Secondary | ICD-10-CM

## 2012-06-04 MED ORDER — ESTRADIOL-NORETHINDRONE ACET 0.05-0.14 MG/DAY TD PTTW: 1.0000 | MEDICATED_PATCH | TRANSDERMAL | Status: DC

## 2012-06-04 NOTE — Telephone Encounter (Signed)
Pt stopped Prempro as she said she had gain 5 lbs in 12 does.  Does not want it and will not take anymore.  She is requesting something else to help with her menopausal symptoms.  Per Dr Penne Lash send in RX for Combipatch BIW to [facility].

## 2012-10-23 ENCOUNTER — Ambulatory Visit (INDEPENDENT_AMBULATORY_CARE_PROVIDER_SITE_OTHER): Payer: BC Managed Care – PPO | Admitting: Family Medicine

## 2012-10-23 ENCOUNTER — Other Ambulatory Visit: Payer: Self-pay | Admitting: Family Medicine

## 2012-10-23 ENCOUNTER — Encounter: Payer: Self-pay | Admitting: Family Medicine

## 2012-10-23 VITALS — BP 108/77 | HR 75 | Wt 142.0 lb

## 2012-10-23 DIAGNOSIS — Z0000 Encounter for general adult medical examination without abnormal findings: Secondary | ICD-10-CM

## 2012-10-23 DIAGNOSIS — Z23 Encounter for immunization: Secondary | ICD-10-CM

## 2012-10-23 DIAGNOSIS — Z131 Encounter for screening for diabetes mellitus: Secondary | ICD-10-CM

## 2012-10-23 LAB — COMPLETE METABOLIC PANEL WITH GFR
Albumin: 4.6 g/dL (ref 3.5–5.2)
CO2: 28 mEq/L (ref 19–32)
Calcium: 9.4 mg/dL (ref 8.4–10.5)
Chloride: 101 mEq/L (ref 96–112)
GFR, Est African American: >89 mL/min
GFR, Est Non African American: >89 mL/min
Glucose, Bld: 80 mg/dL (ref 70–99)
Potassium: 4.3 mEq/L (ref 3.5–5.3)
Sodium: 136 mEq/L (ref 135–145)
Total Protein: 7.4 g/dL (ref 6.0–8.3)

## 2012-10-23 LAB — TSH: TSH: 1.87 u[IU]/mL (ref 0.350–4.500)

## 2012-10-23 NOTE — Progress Notes (Signed)
  Subjective:     Anna Ray is a 47 y.o. female and is here for a comprehensive physical exam. The patient reports no problems.  History   Social History  . Marital Status: Married    Spouse Name: tim    Number of Children: 1  . Years of Education: N/A   Occupational History  . Not on file.   Social History Main Topics  . Smoking status: Former Smoker    Quit date: 11/21/1992  . Smokeless tobacco: Not on file  . Alcohol Use: No  . Drug Use: No  . Sexual Activity: Yes    Partners: Male   Other Topics Concern  . Not on file   Social History Narrative   2 cups of caffeine. Regular exercise.           Health Maintenance  Topic Date Due  . Tetanus/tdap  03/21/2012  . Influenza Vaccine  08/21/2012  . Pap Smear  10/16/2014    The following portions of the patient's history were reviewed and updated as appropriate: allergies, current medications, past family history, past medical history, past social history, past surgical history and problem list.  Review of Systems A comprehensive review of systems was negative.   Objective:    BP 108/77  Pulse 75  Wt 142 lb (64.411 kg)  BMI 24.57 kg/m2  LMP 02/20/2012 General appearance: alert, cooperative and appears stated age Head: Normocephalic, without obvious abnormality, atraumatic Eyes: conj clear, EOMi, PEERLA Ears: normal TM's and external ear canals both ears Nose: Nares normal. Septum midline. Mucosa normal. No drainage or sinus tenderness. Throat: lips, mucosa, and tongue normal; teeth and gums normal Neck: no adenopathy, no carotid bruit, no JVD, supple, symmetrical, trachea midline and thyroid not enlarged, symmetric, no tenderness/mass/nodules Back: symmetric, no curvature. ROM normal. No CVA tenderness. Lungs: clear to auscultation bilaterally Breasts: normal appearance, no masses or tenderness Heart: regular rate and rhythm, S1, S2 normal, no murmur, click, rub or gallop Abdomen: soft, non-tender;  bowel sounds normal; no masses,  no organomegaly Extremities: extremities normal, atraumatic, no cyanosis or edema Pulses: 2+ and symmetric Skin: Skin color, texture, turgor normal. No rashes or lesions Lymph nodes: Cervical, supraclavicular, and axillary nodes normal. Neurologic: Grossly normal    Assessment:    Healthy female exam.      Plan:     See After Visit Summary for Counseling Recommendations  Keep up a regular exercise program and make sure you are eating a healthy diet Try to eat 4 servings of dairy a day, or if you are lactose intolerant take a calcium with vitamin D daily.  Exercises regularly.  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

## 2012-10-24 LAB — VITAMIN D 25 HYDROXY (VIT D DEFICIENCY, FRACTURES): Vit D, 25-Hydroxy: 59 ng/mL (ref 30–89)

## 2012-10-26 NOTE — Progress Notes (Signed)
Quick Note:  All labs are normal. ______ 

## 2012-10-27 ENCOUNTER — Telehealth: Payer: Self-pay | Admitting: *Deleted

## 2012-10-27 NOTE — Telephone Encounter (Signed)
Pt's wellness form faxed.Anna Ray

## 2013-06-29 LAB — LIPID PANEL
CHOLESTEROL: 177 mg/dL (ref 0–200)
HDL: 74 mg/dL — AB (ref 35–70)
LDL Cholesterol: 84 mg/dL
Triglycerides: 94 mg/dL (ref 40–160)

## 2013-06-29 LAB — HM PAP SMEAR

## 2013-06-29 LAB — BASIC METABOLIC PANEL: Glucose: 89 mg/dL

## 2013-06-29 LAB — HEMOGLOBIN A1C: Hgb A1c MFr Bld: 5.6 % (ref 4.0–6.0)

## 2013-10-26 ENCOUNTER — Other Ambulatory Visit: Payer: Self-pay | Admitting: Family Medicine

## 2013-10-26 ENCOUNTER — Ambulatory Visit (INDEPENDENT_AMBULATORY_CARE_PROVIDER_SITE_OTHER): Payer: BC Managed Care – PPO | Admitting: Family Medicine

## 2013-10-26 ENCOUNTER — Encounter: Payer: Self-pay | Admitting: Family Medicine

## 2013-10-26 VITALS — BP 107/65 | HR 80 | Temp 97.9°F | Ht 63.0 in | Wt 146.0 lb

## 2013-10-26 DIAGNOSIS — Z0000 Encounter for general adult medical examination without abnormal findings: Secondary | ICD-10-CM

## 2013-10-26 MED ORDER — HALOBETASOL PROPIONATE 0.05 % EX CREA: TOPICAL_CREAM | Freq: Every day | CUTANEOUS | Status: DC | PRN

## 2013-10-26 NOTE — Patient Instructions (Signed)
Keep up a regular exercise program and make sure you are eating a healthy diet Try to eat 4 servings of dairy a day, or if you are lactose intolerant take a calcium with vitamin D daily.  Your vaccines are up to date.   

## 2013-10-26 NOTE — Progress Notes (Signed)
  Subjective:     Anna Ray is a 48 y.o. female and is here for a comprehensive physical exam. The patient reports no problems.  She is still having difficulty losing weight. Works out regularly. Sees GYN for her breast and pap exam.    History   Social History  . Marital Status: Married    Spouse Name: tim    Number of Children: 1  . Years of Education: N/A   Occupational History  . Not on file.   Social History Main Topics  . Smoking status: Former Smoker    Quit date: 11/21/1992  . Smokeless tobacco: Not on file  . Alcohol Use: No  . Drug Use: No  . Sexual Activity: Yes    Partners: Male   Other Topics Concern  . Not on file   Social History Narrative   2 cups of caffeine. Regular exercise.           Health Maintenance  Topic Date Due  . Influenza Vaccine  10/27/2014 (Originally 08/21/2013)  . Pap Smear  10/16/2014  . Tetanus/tdap  10/24/2022    The following portions of the patient's history were reviewed and updated as appropriate: allergies, current medications, past family history, past medical history, past social history, past surgical history and problem list.  Review of Systems A comprehensive review of systems was negative.   Objective:    BP 107/65  Pulse 80  Temp(Src) 97.9 F (36.6 C)  Ht 5\' 3"  (1.6 m)  Wt 146 lb (66.225 kg)  BMI 25.87 kg/m2  LMP 02/20/2012 General appearance: alert, cooperative and appears stated age Head: Normocephalic, without obvious abnormality, atraumatic Eyes: conj clear, EOMI, PEERLA Ears: normal TM's and external ear canals both ears Nose: Nares normal. Septum midline. Mucosa normal. No drainage or sinus tenderness. Throat: lips, mucosa, and tongue normal; teeth and gums normal Neck: no adenopathy, no carotid bruit, no JVD, supple, symmetrical, trachea midline and thyroid not enlarged, symmetric, no tenderness/mass/nodules Back: symmetric, no curvature. ROM normal. No CVA tenderness. Lungs: clear to  auscultation bilaterally Heart: regular rate and rhythm, S1, S2 normal, no murmur, click, rub or gallop Abdomen: soft, non-tender; bowel sounds normal; no masses,  no organomegaly Extremities: extremities normal, atraumatic, no cyanosis or edema Pulses: 2+ and symmetric Skin: Skin color, texture, turgor normal. No rashes or lesions Lymph nodes: Cervical, supraclavicular, and axillary nodes normal. Neurologic: Alert and oriented X 3, normal strength and tone. Normal symmetric reflexes. Normal coordination and gait    Assessment:    Healthy female exam.      Plan:     See After Visit Summary for Counseling Recommendations  Keep up a regular exercise program and make sure you are eating a healthy diet Try to eat 4 servings of dairy a day, or if you are lactose intolerant take a calcium with vitamin D daily.  Your vaccines are up to date.  Asked her to send us a copy of her bloodwork form work, She is getting her flu shot through work.

## 2013-11-22 ENCOUNTER — Encounter: Payer: Self-pay | Admitting: Family Medicine

## 2013-11-25 ENCOUNTER — Other Ambulatory Visit: Payer: Self-pay | Admitting: *Deleted

## 2013-11-25 ENCOUNTER — Telehealth: Payer: Self-pay | Admitting: *Deleted

## 2013-11-25 DIAGNOSIS — L209 Atopic dermatitis, unspecified: Secondary | ICD-10-CM

## 2013-11-25 MED ORDER — HALOBETASOL PROPIONATE 0.05 % EX OINT: TOPICAL_OINTMENT | CUTANEOUS | Status: DC

## 2013-11-25 MED ORDER — HALOBETASOL PROPIONATE 0.05 % EX CREA
TOPICAL_CREAM | Freq: Every day | CUTANEOUS | Status: DC | PRN
Start: 2013-11-25 — End: 2013-11-25

## 2013-12-04 ENCOUNTER — Encounter: Payer: Self-pay | Admitting: Family Medicine

## 2013-12-07 NOTE — Telephone Encounter (Signed)
Error

## 2014-01-07 ENCOUNTER — Encounter: Payer: Self-pay | Admitting: Physician Assistant

## 2014-01-07 ENCOUNTER — Ambulatory Visit (INDEPENDENT_AMBULATORY_CARE_PROVIDER_SITE_OTHER): Payer: BC Managed Care – PPO | Admitting: Physician Assistant

## 2014-01-07 VITALS — BP 120/69 | HR 84 | Temp 97.8°F | Ht 64.0 in | Wt 148.0 lb

## 2014-01-07 DIAGNOSIS — M545 Low back pain, unspecified: Secondary | ICD-10-CM

## 2014-01-07 LAB — POCT URINALYSIS DIPSTICK
Bilirubin, UA: NEGATIVE
Blood, UA: NEGATIVE
GLUCOSE UA: NEGATIVE
KETONES UA: NEGATIVE
LEUKOCYTES UA: NEGATIVE
Nitrite, UA: NEGATIVE
Protein, UA: NEGATIVE
SPEC GRAV UA: 1.01
Urobilinogen, UA: 0.2
pH, UA: 7

## 2014-01-07 MED ORDER — TRAMADOL HCL 50 MG PO TABS: 50.0000 mg | ORAL_TABLET | Freq: Three times a day (TID) | ORAL | Status: DC | PRN

## 2014-01-07 NOTE — Patient Instructions (Signed)
OTC advil up to 800mg  up to three times.   Low Back Strain with Rehab A strain is an injury in which a tendon or muscle is torn. The muscles and tendons of the lower back are vulnerable to strains. However, these muscles and tendons are very strong and require a great force to be injured. Strains are classified into three categories. Grade 1 strains cause pain, but the tendon is not lengthened. Grade 2 strains include a lengthened ligament, due to the ligament being stretched or partially ruptured. With grade 2 strains there is still function, although the function may be decreased. Grade 3 strains involve a complete tear of the tendon or muscle, and function is usually impaired. SYMPTOMS   Pain in the lower back.  Pain that affects one side more than the other.  Pain that gets worse with movement and may be felt in the hip, buttocks, or back of the thigh.  Muscle spasms of the muscles in the back.  Swelling along the muscles of the back.  Loss of strength of the back muscles.  Crackling sound (crepitation) when the muscles are touched. CAUSES  Lower back strains occur when a force is placed on the muscles or tendons that is greater than they can handle. Common causes of injury include:  Prolonged overuse of the muscle-tendon units in the lower back, usually from incorrect posture.  A single violent injury or force applied to the back. RISK INCREASES WITH:  Sports that involve twisting forces on the spine or a lot of bending at the waist (football, rugby, weightlifting, bowling, golf, tennis, speed skating, racquetball, swimming, running, gymnastics, diving).  Poor strength and flexibility.  Failure to warm up properly before activity.  Family history of lower back pain or disk disorders.  Previous back injury or surgery (especially fusion).  Poor posture with lifting, especially heavy objects.  Prolonged sitting, especially with poor posture. PREVENTION   Learn and use  proper posture when sitting or lifting (maintain proper posture when sitting, lift using the knees and legs, not at the waist).  Warm up and stretch properly before activity.  Allow for adequate recovery between workouts.  Maintain physical fitness:  Strength, flexibility, and endurance.  Cardiovascular fitness. PROGNOSIS  If treated properly, lower back strains usually heal within 6 weeks. RELATED COMPLICATIONS   Recurring symptoms, resulting in a chronic problem.  Chronic inflammation, scarring, and partial muscle-tendon tear.  Delayed healing or resolution of symptoms.  Prolonged disability. TREATMENT  Treatment first involves the use of ice and medicine, to reduce pain and inflammation. The use of strengthening and stretching exercises may help reduce pain with activity. These exercises may be performed at home or with a therapist. Severe injuries may require referral to a therapist for further evaluation and treatment, such as ultrasound. Your caregiver may advise that you wear a back brace or corset, to help reduce pain and discomfort. Often, prolonged bed rest results in greater harm then benefit. Corticosteroid injections may be recommended. However, these should be reserved for the most serious cases. It is important to avoid using your back when lifting objects. At night, sleep on your back on a firm mattress with a pillow placed under your knees. If non-surgical treatment is unsuccessful, surgery may be needed.  MEDICATION   If pain medicine is needed, nonsteroidal anti-inflammatory medicines (aspirin and ibuprofen), or other minor pain relievers (acetaminophen), are often advised.  Do not take pain medicine for 7 days before surgery.  Prescription pain relievers may be  given, if your caregiver thinks they are needed. Use only as directed and only as much as you need.  Ointments applied to the skin may be helpful.  Corticosteroid injections may be given by your caregiver.  These injections should be reserved for the most serious cases, because they may only be given a certain number of times. HEAT AND COLD  Cold treatment (icing) should be applied for 10 to 15 minutes every 2 to 3 hours for inflammation and pain, and immediately after activity that aggravates your symptoms. Use ice packs or an ice massage.  Heat treatment may be used before performing stretching and strengthening activities prescribed by your caregiver, physical therapist, or athletic trainer. Use a heat pack or a warm water soak. SEEK MEDICAL CARE IF:   Symptoms get worse or do not improve in 2 to 4 weeks, despite treatment.  You develop numbness, weakness, or loss of bowel or bladder function.  New, unexplained symptoms develop. (Drugs used in treatment may produce side effects.) EXERCISES  RANGE OF MOTION (ROM) AND STRETCHING EXERCISES - Low Back Strain Most people with lower back pain will find that their symptoms get worse with excessive bending forward (flexion) or arching at the lower back (extension). The exercises which will help resolve your symptoms will focus on the opposite motion.  Your physician, physical therapist or athletic trainer will help you determine which exercises will be most helpful to resolve your lower back pain. Do not complete any exercises without first consulting with your caregiver. Discontinue any exercises which make your symptoms worse until you speak to your caregiver.  If you have pain, numbness or tingling which travels down into your buttocks, leg or foot, the goal of the therapy is for these symptoms to move closer to your back and eventually resolve. Sometimes, these leg symptoms will get better, but your lower back pain may worsen. This is typically an indication of progress in your rehabilitation. Be very alert to any changes in your symptoms and the activities in which you participated in the 24 hours prior to the change. Sharing this information with your  caregiver will allow him/her to most efficiently treat your condition.  These exercises may help you when beginning to rehabilitate your injury. Your symptoms may resolve with or without further involvement from your physician, physical therapist or athletic trainer. While completing these exercises, remember:  Restoring tissue flexibility helps normal motion to return to the joints. This allows healthier, less painful movement and activity.  An effective stretch should be held for at least 30 seconds.  A stretch should never be painful. You should only feel a gentle lengthening or release in the stretched tissue. FLEXION RANGE OF MOTION AND STRETCHING EXERCISES: STRETCH - Flexion, Single Knee to Chest   Lie on a firm bed or floor with both legs extended in front of you.  Keeping one leg in contact with the floor, bring your opposite knee to your chest. Hold your leg in place by either grabbing behind your thigh or at your knee.  Pull until you feel a gentle stretch in your lower back. Hold __________ seconds.  Slowly release your grasp and repeat the exercise with the opposite side. Repeat __________ times. Complete this exercise __________ times per day.  STRETCH - Flexion, Double Knee to Chest   Lie on a firm bed or floor with both legs extended in front of you.  Keeping one leg in contact with the floor, bring your opposite knee to your chest.  Tense your stomach muscles to support your back and then lift your other knee to your chest. Hold your legs in place by either grabbing behind your thighs or at your knees.  Pull both knees toward your chest until you feel a gentle stretch in your lower back. Hold __________ seconds.  Tense your stomach muscles and slowly return one leg at a time to the floor. Repeat __________ times. Complete this exercise __________ times per day.  STRETCH - Low Trunk Rotation  Lie on a firm bed or floor. Keeping your legs in front of you, bend your  knees so they are both pointed toward the ceiling and your feet are flat on the floor.  Extend your arms out to the side. This will stabilize your upper body by keeping your shoulders in contact with the floor.  Gently and slowly drop both knees together to one side until you feel a gentle stretch in your lower back. Hold for __________ seconds.  Tense your stomach muscles to support your lower back as you bring your knees back to the starting position. Repeat the exercise to the other side. Repeat __________ times. Complete this exercise __________ times per day  EXTENSION RANGE OF MOTION AND FLEXIBILITY EXERCISES: STRETCH - Extension, Prone on Elbows   Lie on your stomach on the floor, a bed will be too soft. Place your palms about shoulder width apart and at the height of your head.  Place your elbows under your shoulders. If this is too painful, stack pillows under your chest.  Allow your body to relax so that your hips drop lower and make contact more completely with the floor.  Hold this position for __________ seconds.  Slowly return to lying flat on the floor. Repeat __________ times. Complete this exercise __________ times per day.  RANGE OF MOTION - Extension, Prone Press Ups  Lie on your stomach on the floor, a bed will be too soft. Place your palms about shoulder width apart and at the height of your head.  Keeping your back as relaxed as possible, slowly straighten your elbows while keeping your hips on the floor. You may adjust the placement of your hands to maximize your comfort. As you gain motion, your hands will come more underneath your shoulders.  Hold this position __________ seconds.  Slowly return to lying flat on the floor. Repeat __________ times. Complete this exercise __________ times per day.  RANGE OF MOTION- Quadruped, Neutral Spine   Assume a hands and knees position on a firm surface. Keep your hands under your shoulders and your knees under your hips.  You may place padding under your knees for comfort.  Drop your head and point your tail bone toward the ground below you. This will round out your lower back like an angry cat. Hold this position for __________ seconds.  Slowly lift your head and release your tail bone so that your back sags into a large arch, like an old horse.  Hold this position for __________ seconds.  Repeat this until you feel limber in your lower back.  Now, find your "sweet spot." This will be the most comfortable position somewhere between the two previous positions. This is your neutral spine. Once you have found this position, tense your stomach muscles to support your lower back.  Hold this position for __________ seconds. Repeat __________ times. Complete this exercise __________ times per day.  STRENGTHENING EXERCISES - Low Back Strain These exercises may help you when beginning to rehabilitate  your injury. These exercises should be done near your "sweet spot." This is the neutral, low-back arch, somewhere between fully rounded and fully arched, that is your least painful position. When performed in this safe range of motion, these exercises can be used for people who have either a flexion or extension based injury. These exercises may resolve your symptoms with or without further involvement from your physician, physical therapist or athletic trainer. While completing these exercises, remember:   Muscles can gain both the endurance and the strength needed for everyday activities through controlled exercises.  Complete these exercises as instructed by your physician, physical therapist or athletic trainer. Increase the resistance and repetitions only as guided.  You may experience muscle soreness or fatigue, but the pain or discomfort you are trying to eliminate should never worsen during these exercises. If this pain does worsen, stop and make certain you are following the directions exactly. If the pain is still  present after adjustments, discontinue the exercise until you can discuss the trouble with your caregiver. STRENGTHENING - Deep Abdominals, Pelvic Tilt  Lie on a firm bed or floor. Keeping your legs in front of you, bend your knees so they are both pointed toward the ceiling and your feet are flat on the floor.  Tense your lower abdominal muscles to press your lower back into the floor. This motion will rotate your pelvis so that your tail bone is scooping upwards rather than pointing at your feet or into the floor.  With a gentle tension and even breathing, hold this position for __________ seconds. Repeat __________ times. Complete this exercise __________ times per day.  STRENGTHENING - Abdominals, Crunches   Lie on a firm bed or floor. Keeping your legs in front of you, bend your knees so they are both pointed toward the ceiling and your feet are flat on the floor. Cross your arms over your chest.  Slightly tip your chin down without bending your neck.  Tense your abdominals and slowly lift your trunk high enough to just clear your shoulder blades. Lifting higher can put excessive stress on the lower back and does not further strengthen your abdominal muscles.  Control your return to the starting position. Repeat __________ times. Complete this exercise __________ times per day.  STRENGTHENING - Quadruped, Opposite UE/LE Lift   Assume a hands and knees position on a firm surface. Keep your hands under your shoulders and your knees under your hips. You may place padding under your knees for comfort.  Find your neutral spine and gently tense your abdominal muscles so that you can maintain this position. Your shoulders and hips should form a rectangle that is parallel with the floor and is not twisted.  Keeping your trunk steady, lift your right hand no higher than your shoulder and then your left leg no higher than your hip. Make sure you are not holding your breath. Hold this position  __________ seconds.  Continuing to keep your abdominal muscles tense and your back steady, slowly return to your starting position. Repeat with the opposite arm and leg. Repeat __________ times. Complete this exercise __________ times per day.  STRENGTHENING - Lower Abdominals, Double Knee Lift  Lie on a firm bed or floor. Keeping your legs in front of you, bend your knees so they are both pointed toward the ceiling and your feet are flat on the floor.  Tense your abdominal muscles to brace your lower back and slowly lift both of your knees until they come  over your hips. Be certain not to hold your breath.  Hold __________ seconds. Using your abdominal muscles, return to the starting position in a slow and controlled manner. Repeat __________ times. Complete this exercise __________ times per day.  POSTURE AND BODY MECHANICS CONSIDERATIONS - Low Back Strain Keeping correct posture when sitting, standing or completing your activities will reduce the stress put on different body tissues, allowing injured tissues a chance to heal and limiting painful experiences. The following are general guidelines for improved posture. Your physician or physical therapist will provide you with any instructions specific to your needs. While reading these guidelines, remember:  The exercises prescribed by your provider will help you have the flexibility and strength to maintain correct postures.  The correct posture provides the best environment for your joints to work. All of your joints have less wear and tear when properly supported by a spine with good posture. This means you will experience a healthier, less painful body.  Correct posture must be practiced with all of your activities, especially prolonged sitting and standing. Correct posture is as important when doing repetitive low-stress activities (typing) as it is when doing a single heavy-load activity (lifting). RESTING POSITIONS Consider which  positions are most painful for you when choosing a resting position. If you have pain with flexion-based activities (sitting, bending, stooping, squatting), choose a position that allows you to rest in a less flexed posture. You would want to avoid curling into a fetal position on your side. If your pain worsens with extension-based activities (prolonged standing, working overhead), avoid resting in an extended position such as sleeping on your stomach. Most people will find more comfort when they rest with their spine in a more neutral position, neither too rounded nor too arched. Lying on a non-sagging bed on your side with a pillow between your knees, or on your back with a pillow under your knees will often provide some relief. Keep in mind, being in any one position for a prolonged period of time, no matter how correct your posture, can still lead to stiffness. PROPER SITTING POSTURE In order to minimize stress and discomfort on your spine, you must sit with correct posture. Sitting with good posture should be effortless for a healthy body. Returning to good posture is a gradual process. Many people can work toward this most comfortably by using various supports until they have the flexibility and strength to maintain this posture on their own. When sitting with proper posture, your ears will fall over your shoulders and your shoulders will fall over your hips. You should use the back of the chair to support your upper back. Your lower back will be in a neutral position, just slightly arched. You may place a small pillow or folded towel at the base of your lower back for support.  When working at a desk, create an environment that supports good, upright posture. Without extra support, muscles tire, which leads to excessive strain on joints and other tissues. Keep these recommendations in mind: CHAIR:  A chair should be able to slide under your desk when your back makes contact with the back of the chair.  This allows you to work closely.  The chair's height should allow your eyes to be level with the upper part of your monitor and your hands to be slightly lower than your elbows. BODY POSITION  Your feet should make contact with the floor. If this is not possible, use a foot rest.  Keep your ears  over your shoulders. This will reduce stress on your neck and lower back. INCORRECT SITTING POSTURES  If you are feeling tired and unable to assume a healthy sitting posture, do not slouch or slump. This puts excessive strain on your back tissues, causing more damage and pain. Healthier options include:  Using more support, like a lumbar pillow.  Switching tasks to something that requires you to be upright or walking.  Talking a brief walk.  Lying down to rest in a neutral-spine position. PROLONGED STANDING WHILE SLIGHTLY LEANING FORWARD  When completing a task that requires you to lean forward while standing in one place for a long time, place either foot up on a stationary 2-4 inch high object to help maintain the best posture. When both feet are on the ground, the lower back tends to lose its slight inward curve. If this curve flattens (or becomes too large), then the back and your other joints will experience too much stress, tire more quickly, and can cause pain. CORRECT STANDING POSTURES Proper standing posture should be assumed with all daily activities, even if they only take a few moments, like when brushing your teeth. As in sitting, your ears should fall over your shoulders and your shoulders should fall over your hips. You should keep a slight tension in your abdominal muscles to brace your spine. Your tailbone should point down to the ground, not behind your body, resulting in an over-extended swayback posture.  INCORRECT STANDING POSTURES  Common incorrect standing postures include a forward head, locked knees and/or an excessive swayback. WALKING Walk with an upright posture. Your  ears, shoulders and hips should all line-up. PROLONGED ACTIVITY IN A FLEXED POSITION When completing a task that requires you to bend forward at your waist or lean over a low surface, try to find a way to stabilize 3 out of 4 of your limbs. You can place a hand or elbow on your thigh or rest a knee on the surface you are reaching across. This will provide you more stability so that your muscles do not fatigue as quickly. By keeping your knees relaxed, or slightly bent, you will also reduce stress across your lower back. CORRECT LIFTING TECHNIQUES DO :   Assume a wide stance. This will provide you more stability and the opportunity to get as close as possible to the object which you are lifting.  Tense your abdominals to brace your spine. Bend at the knees and hips. Keeping your back locked in a neutral-spine position, lift using your leg muscles. Lift with your legs, keeping your back straight.  Test the weight of unknown objects before attempting to lift them.  Try to keep your elbows locked down at your sides in order get the best strength from your shoulders when carrying an object.  Always ask for help when lifting heavy or awkward objects. INCORRECT LIFTING TECHNIQUES DO NOT:   Lock your knees when lifting, even if it is a small object.  Bend and twist. Pivot at your feet or move your feet when needing to change directions.  Assume that you can safely pick up even a paper clip without proper posture. Document Released: 01/07/2005 Document Revised: 04/01/2011 Document Reviewed: 04/21/2008 [facility] Patient Information 2015 [place], [place]. This information is not intended to replace advice given to you by your health care provider. Make sure you discuss any questions you have with your health care provider.

## 2014-01-07 NOTE — Progress Notes (Signed)
   Subjective:    Patient ID: Anna Ray, female    DOB: 04/28/1965, 48 y.o.   MRN: [id number]  HPI  Pt presents to the clinic with low back pain. Last week she was sitting all week for training and not going to the gym. She was having lower back pain. Saturday she had episode excuriating 8/10 pain that radiated through lower back but not into legs. Had a few bouts of loose stools. Right now pain is much better 1/10. Started back exercising this week. No bowel or bladder dysfunction. No saddle anthesthia. No increase urination, dysuria or changes in frequency.    Review of Systems  All other systems reviewed and are negative.      Objective:   Physical Exam  Constitutional: She is oriented to person, place, and time. She appears well-developed and well-nourished.  HENT:  Head: Normocephalic and atraumatic.  Cardiovascular: Normal rate, regular rhythm and normal heart sounds.   Pulmonary/Chest: Effort normal and breath sounds normal. She has no wheezes.  Musculoskeletal:  No pain to palpation over spine.  Normal ROM at waist without pain.  No tenderness but tightness over lumbar paraspinous muscles.  Negative straight leg test. NROM at hips.  Strength of bilateral legs 5/5.   Neurological: She is alert and oriented to person, place, and time.  Skin: Skin is dry.  Psychiatric: She has a normal mood and affect. Her behavior is normal.          Assessment & Plan:  Midline low back pain without scaitica- .Marland Kitchen. Results for orders placed or performed in visit on 01/07/14  Urine Culture  Result Value Ref Range   Colony Count 9,000 COLONIES/ML    Organism ID, Bacteria Insignificant Growth   POCT urinalysis dipstick  Result Value Ref Range   Color, UA yellow    Clarity, UA clear    Glucose, UA neg    Bilirubin, UA neg    Ketones, UA neg    Spec Grav, UA 1.010    Blood, UA neg    pH, UA 7.0    Protein, UA neg    Urobilinogen, UA 0.2    Nitrite, UA neg    Leukocytes, UA  Negative    No signs of infection.   Tramadol as needed for pain. Ibuprofen 800mg  up to three times a day as needed for next 3-5 days. Warm compresses. Low back stretches given. Offered muscle relaxer and pt declined.  Follow up if not improving. No red flags will hold off on imaging.

## 2014-01-09 LAB — URINE CULTURE

## 2014-06-21 ENCOUNTER — Ambulatory Visit: Payer: Self-pay | Admitting: Obstetrics & Gynecology

## 2014-06-30 ENCOUNTER — Ambulatory Visit: Payer: Self-pay | Admitting: Obstetrics & Gynecology

## 2014-08-03 ENCOUNTER — Encounter: Payer: Self-pay | Admitting: Family Medicine

## 2014-10-28 ENCOUNTER — Encounter: Payer: Self-pay | Admitting: Family Medicine

## 2014-11-08 ENCOUNTER — Encounter: Payer: Self-pay | Admitting: Family Medicine

## 2014-11-08 ENCOUNTER — Ambulatory Visit (INDEPENDENT_AMBULATORY_CARE_PROVIDER_SITE_OTHER): Payer: BLUE CROSS/BLUE SHIELD | Admitting: Family Medicine

## 2014-11-08 VITALS — BP 115/47 | HR 75 | Temp 98.2°F | Resp 16 | Ht 64.0 in | Wt 156.3 lb

## 2014-11-08 DIAGNOSIS — Z1239 Encounter for other screening for malignant neoplasm of breast: Secondary | ICD-10-CM

## 2014-11-08 DIAGNOSIS — Z0000 Encounter for general adult medical examination without abnormal findings: Secondary | ICD-10-CM | POA: Diagnosis not present

## 2014-11-08 NOTE — Progress Notes (Signed)
  Subjective:     Anna Ray is a 49 y.o. female and is here for a comprehensive physical exam. The patient reports no problems.  Social History   Social History  . Marital Status: Married    Spouse Name: tim  . Number of Children: 1  . Years of Education: N/A   Occupational History  . Not on file.   Social History Main Topics  . Smoking status: Former Smoker    Quit date: 11/21/1992  . Smokeless tobacco: Not on file  . Alcohol Use: No  . Drug Use: No  . Sexual Activity:    Partners: Male   Other Topics Concern  . Not on file   Social History Narrative   2 cups of caffeine. Regular exercise.           Health Maintenance  Topic Date Due  . INFLUENZA VACCINE  11/08/2015 (Originally 08/22/2014)  . HIV Screening  11/08/2015 (Originally [birth year]10/1982)  . PAP SMEAR  06/29/2016  . TETANUS/TDAP  10/24/2022    The following portions of the patient's history were reviewed and updated as appropriate: allergies, current medications, past family history, past medical history, past social history, past surgical history and problem list.  Review of Systems Pertinent items noted in HPI and remainder of comprehensive ROS otherwise negative.   Objective:    BP 115/47 mmHg  Pulse 75  Temp(Src) 98.2 F (36.8 C) (Oral)  Resp 16  Ht 5\' 4"  (1.626 m)  Wt 156 lb 4.8 oz (70.897 kg)  BMI 26.82 kg/m2  SpO2 98%  LMP 02/20/2012 General appearance: alert, cooperative and appears stated age Head: Normocephalic, without obvious abnormality, atraumatic Eyes: conj clear, EOMI, PEERLA Ears: normal TM's and external ear canals both ears Nose: Nares normal. Septum midline. Mucosa normal. No drainage or sinus tenderness. Throat: lips, mucosa, and tongue normal; teeth and gums normal Neck: no adenopathy, no carotid bruit, no JVD, supple, symmetrical, trachea midline and thyroid not enlarged, symmetric, no tenderness/mass/nodules Back: symmetric, no curvature. ROM normal. No CVA  tenderness. Lungs: clear to auscultation bilaterally Breasts: normal appearance, no masses or tenderness Heart: regular rate and rhythm, S1, S2 normal, no murmur, click, rub or gallop Abdomen: soft, non-tender; bowel sounds normal; no masses,  no organomegaly Extremities: extremities normal, atraumatic, no cyanosis or edema Pulses: 2+ and symmetric Skin: Skin color, texture, turgor normal. No rashes or lesions Lymph nodes: Cervical, supraclavicular, and axillary nodes normal. Neurologic: Alert and oriented X 3, normal strength and tone. Normal symmetric reflexes. Normal coordination and gait    Assessment:    Healthy female exam.      Plan:     See After Visit Summary for Counseling Recommendations  Keep up a regular exercise program and make sure you are eating a healthy diet Try to eat 4 servings of dairy a day, or if you are lactose intolerant take a calcium with vitamin D daily.  Your vaccines are up to date.

## 2014-11-08 NOTE — Addendum Note (Signed)
Addended by: Baird KayUGLAS, Kassadie Pancake M on: 11/08/2014 01:46 PM   Modules accepted: Orders

## 2014-11-24 ENCOUNTER — Ambulatory Visit (INDEPENDENT_AMBULATORY_CARE_PROVIDER_SITE_OTHER): Payer: BLUE CROSS/BLUE SHIELD

## 2014-11-24 DIAGNOSIS — Z1231 Encounter for screening mammogram for malignant neoplasm of breast: Secondary | ICD-10-CM

## 2014-11-24 DIAGNOSIS — Z1239 Encounter for other screening for malignant neoplasm of breast: Secondary | ICD-10-CM

## 2014-11-25 LAB — COMPLETE METABOLIC PANEL WITH GFR
ALT: 25 U/L (ref 6–29)
AST: 28 U/L (ref 10–35)
Albumin: 4.2 g/dL (ref 3.6–5.1)
Alkaline Phosphatase: 103 U/L (ref 33–115)
BUN: 12 mg/dL (ref 7–25)
CHLORIDE: 102 mmol/L (ref 98–110)
CO2: 31 mmol/L (ref 20–31)
Calcium: 9.3 mg/dL (ref 8.6–10.2)
Creat: 0.71 mg/dL (ref 0.50–1.10)
Glucose, Bld: 79 mg/dL (ref 65–99)
Potassium: 4.5 mmol/L (ref 3.5–5.3)
SODIUM: 139 mmol/L (ref 135–146)
Total Bilirubin: 0.6 mg/dL (ref 0.2–1.2)
Total Protein: 6.8 g/dL (ref 6.1–8.1)

## 2014-11-25 LAB — LIPID PANEL
CHOL/HDL RATIO: 2 ratio (ref <=5.0)
Cholesterol: 135 mg/dL (ref 125–200)
HDL: 66 mg/dL (ref >=46)
LDL CALC: 54 mg/dL (ref <130)
Triglycerides: 75 mg/dL (ref <150)
VLDL: 15 mg/dL (ref <30)

## 2014-11-25 LAB — VITAMIN D 25 HYDROXY (VIT D DEFICIENCY, FRACTURES): VIT D 25 HYDROXY: 46 ng/mL (ref 30–100)

## 2015-10-18 ENCOUNTER — Telehealth: Payer: Self-pay | Admitting: *Deleted

## 2015-10-18 ENCOUNTER — Other Ambulatory Visit: Payer: Self-pay | Admitting: *Deleted

## 2015-10-18 DIAGNOSIS — Z0000 Encounter for general adult medical examination without abnormal findings: Secondary | ICD-10-CM

## 2015-10-18 DIAGNOSIS — R635 Abnormal weight gain: Secondary | ICD-10-CM

## 2015-10-18 NOTE — Addendum Note (Signed)
Addended by: Deno EtienneBARKLEY, Nehemiah Montee L on: 10/18/2015 03:52 PM   Modules accepted: Orders

## 2015-10-18 NOTE — Telephone Encounter (Signed)
Pt informed that labs have been faxed. She would like labs ordered for recent weight gain.Loralee PacasBarkley, Chelcie Estorga TalentLynetta

## 2015-10-19 LAB — CBC WITH DIFFERENTIAL/PLATELET
BASOS PCT: 1 %
Basophils Absolute: 52 cells/uL (ref 0–200)
EOS PCT: 2 %
Eosinophils Absolute: 104 cells/uL (ref 15–500)
HCT: 42.1 % (ref 35.0–45.0)
HEMOGLOBIN: 14.3 g/dL (ref 11.7–15.5)
LYMPHS ABS: 1456 {cells}/uL (ref 850–3900)
Lymphocytes Relative: 28 %
MCH: 31.2 pg (ref 27.0–33.0)
MCHC: 34 g/dL (ref 32.0–36.0)
MCV: 91.7 fL (ref 80.0–100.0)
MONOS PCT: 8 %
MPV: 10.8 fL (ref 7.5–12.5)
Monocytes Absolute: 416 cells/uL (ref 200–950)
NEUTROS ABS: 3172 {cells}/uL (ref 1500–7800)
Neutrophils Relative %: 61 %
PLATELETS: 268 10*3/uL (ref 140–400)
RBC: 4.59 MIL/uL (ref 3.80–5.10)
RDW: 13 % (ref 11.0–15.0)
WBC: 5.2 10*3/uL (ref 3.8–10.8)

## 2015-10-20 LAB — COMPLETE METABOLIC PANEL WITH GFR
ALT: 17 U/L (ref 6–29)
AST: 22 U/L (ref 10–35)
Albumin: 4.4 g/dL (ref 3.6–5.1)
Alkaline Phosphatase: 102 U/L (ref 33–115)
BUN: 9 mg/dL (ref 7–25)
CALCIUM: 9.5 mg/dL (ref 8.6–10.2)
CHLORIDE: 103 mmol/L (ref 98–110)
CO2: 28 mmol/L (ref 20–31)
Creat: 0.7 mg/dL (ref 0.50–1.10)
GLUCOSE: 84 mg/dL (ref 65–99)
POTASSIUM: 4.6 mmol/L (ref 3.5–5.3)
Sodium: 143 mmol/L (ref 135–146)
Total Bilirubin: 0.6 mg/dL (ref 0.2–1.2)
Total Protein: 7.1 g/dL (ref 6.1–8.1)

## 2015-10-20 LAB — PROLACTIN: Prolactin: 6.9 ng/mL

## 2015-10-20 LAB — VITAMIN B12: VITAMIN B 12: 1124 pg/mL — AB (ref 200–1100)

## 2015-10-20 LAB — CORTISOL-AM, BLOOD: CORTISOL - AM: 21.1 ug/dL

## 2015-10-20 LAB — T4, FREE: FREE T4: 1.1 ng/dL (ref 0.8–1.8)

## 2015-10-20 LAB — VITAMIN D 25 HYDROXY (VIT D DEFICIENCY, FRACTURES): Vit D, 25-Hydroxy: 39 ng/mL (ref 30–100)

## 2015-10-20 LAB — LIPID PANEL
CHOL/HDL RATIO: 2 ratio (ref <=5.0)
Cholesterol: 137 mg/dL (ref 125–200)
HDL: 67 mg/dL (ref >=46)
LDL CALC: 54 mg/dL (ref <130)
TRIGLYCERIDES: 79 mg/dL (ref <150)
VLDL: 16 mg/dL (ref <30)

## 2015-10-20 LAB — TSH: TSH: 1.82 m[IU]/L

## 2015-10-20 LAB — FERRITIN: FERRITIN: 69 ng/mL (ref 10–232)

## 2015-10-20 LAB — T3: T3 TOTAL: 135 ng/dL (ref 76–181)

## 2015-10-20 LAB — ESTRADIOL: ESTRADIOL: 29 pg/mL

## 2015-10-30 ENCOUNTER — Ambulatory Visit (INDEPENDENT_AMBULATORY_CARE_PROVIDER_SITE_OTHER): Payer: BLUE CROSS/BLUE SHIELD | Admitting: Family Medicine

## 2015-10-30 ENCOUNTER — Encounter: Payer: Self-pay | Admitting: Family Medicine

## 2015-10-30 VITALS — BP 115/71 | HR 81 | Ht 64.0 in | Wt 160.0 lb

## 2015-10-30 DIAGNOSIS — R635 Abnormal weight gain: Secondary | ICD-10-CM

## 2015-10-30 DIAGNOSIS — Z0000 Encounter for general adult medical examination without abnormal findings: Secondary | ICD-10-CM | POA: Diagnosis not present

## 2015-10-30 NOTE — Progress Notes (Signed)
Subjective:     Anna Ray is a 50 y.o. female and is here for a comprehensive physical exam. The patient reports problems - gaining weight.she says she weight more than when she was pregnant. She has been under a lot of stress in the last 1.5 years. Lost her mother and other stressors.   Social History   Social History  . Marital status: Married    Spouse name: tim  . Number of children: 1  . Years of education: N/A   Occupational History  . Not on file.   Social History Main Topics  . Smoking status: Former Smoker    Quit date: 11/21/1992  . Smokeless tobacco: Not on file  . Alcohol use No  . Drug use: No  . Sexual activity: Yes    Partners: Male   Other Topics Concern  . Not on file   Social History Narrative   2 cups of caffeine. Regular exercise.           Health Maintenance  Topic Date Due  . INFLUENZA VACCINE  11/08/2015 (Originally 08/22/2015)  . HIV Screening  11/08/2015 (Originally 02/10/1980)  . PAP SMEAR  06/29/2016  . TETANUS/TDAP  10/24/2022    The following portions of the patient's history were reviewed and updated as appropriate: allergies, current medications, past family history, past medical history, past social history, past surgical history and problem list.  Review of Systems A comprehensive review of systems was negative.   Objective:    BP 115/71   Pulse 81   Ht 5\' 4"  (1.626 m)   Wt 160 lb (72.6 kg)   LMP 02/20/2012   SpO2 100%   BMI 27.46 kg/m   General appearance: alert, cooperative and appears stated age Head: Normocephalic, without obvious abnormality, atraumatic Eyes: conj clear, EOMI, PEERLA Ears: normal TM's and external ear canals both ears Nose: Nares normal. Septum midline. Mucosa normal. No drainage or sinus tenderness. Throat: lips, mucosa, and tongue normal; teeth and gums normal Neck: no adenopathy, no carotid bruit, no JVD, supple, symmetrical, trachea midline and thyroid not enlarged, symmetric, no  tenderness/mass/nodules Back: symmetric, no curvature. ROM normal. No CVA tenderness. Lungs: clear to auscultation bilaterally Breasts: normal appearance, no masses or tenderness Heart: regular rate and rhythm, S1, S2 normal, no murmur, click, rub or gallop Abdomen: soft, non-tender; bowel sounds normal; no masses,  no organomegaly Extremities: extremities normal, atraumatic, no cyanosis or edema Pulses: 2+ and symmetric Skin: Skin color, texture, turgor normal. No rashes or lesions Lymph nodes: Cervical, supraclavicular, and axillary nodes normal. Neurologic: Alert and oriented X 3, normal strength and tone. Normal symmetric reflexes. Normal coordination and gait    Assessment:    Healthy female exam.      Plan:     See After Visit Summary for Counseling Recommendations   Keep up a regular exercise program and make sure you are eating a healthy diet Try to eat 4 servings of dairy a day, or if you are lactose intolerant take a calcium with vitamin D daily.  Your vaccines are up to date.  Given copy of recent labs.   B12 Levels were too high on recent lab work. She actually been taking a supplement that she brought in today which has greater than 1000% of the daily recommended. She says she has dropped back to 1 a day and was grossly taking 2 a day. Plan to recheck her B12 level in 3 months.  Abnormal weight gain - discussed options.  She works out regularly and feels like she eats a very healthy diet. Recommend that she use an smart phone application such as my fitness pal to help retract calories and see if she is able to pinpoint the calories that she could eliminate to promote weight loss.if not working well afer a month then could consider a medication such as phentermine.

## 2015-10-30 NOTE — Patient Instructions (Addendum)
Keep up a regular exercise program and make sure you are eating a healthy diet Try to eat 4 servings of dairy a day, or if you are lactose intolerant take a calcium with vitamin D daily.  Your vaccines are up to date.    Can try My Fitness Pal smart phone app to track calories and set goals.

## 2015-10-31 ENCOUNTER — Telehealth: Payer: Self-pay | Admitting: Family Medicine

## 2015-10-31 NOTE — Telephone Encounter (Signed)
Pt states she was seen in office yesterday and mentioned to PCP that her ears were "bothering her." Pt states PCP looked in her ears but "didn't see anything" and was advised to contact office if worse. Pt reports they are "more painful" today and questions if she needs to come back in or if an Rx can be sent to local pharmacy. Will route.

## 2015-11-01 NOTE — Telephone Encounter (Signed)
Pt reports she cannot use Flonase, and nasal spray causes headaches. Pt is taking advil cold and sinus which has helped her symptoms some. Pt advised to contact the clinic by Friday am if not feeling better so an appt can be made for evaluation. Verbalized understanding.

## 2015-11-01 NOTE — Telephone Encounter (Signed)
Recommend a trial of a nasal steroid spray. This can help open up the eustachian tubes. Recommend over-the-counter Flonase 2 sprays in each nostril. If the ears are getting worse then please call back or if not better by Monday then please come in to recheck the ears.

## 2015-11-02 NOTE — Telephone Encounter (Signed)
Option #2 would be an oral steroid for 5 days for her ear.

## 2015-11-03 MED ORDER — PREDNISONE 20 MG PO TABS: 40.0000 mg | ORAL_TABLET | Freq: Every day | ORAL | 0 refills | Status: DC

## 2015-11-03 NOTE — Addendum Note (Signed)
Addended by: Nani GasserMETHENEY, Arie Powell D on: 11/03/2015 05:00 PM   Modules accepted: Orders

## 2015-11-03 NOTE — Telephone Encounter (Signed)
Called to check on Pt. She has been using a netty pot, advil cold/sinus, and doing some accupressure. She is still having some symptoms. Would like the try the oral steroid. Will route to PCP. Pt states she has to pay for this OOP [facility] [address] [place]. Added this pharmacy to file.

## 2015-11-03 NOTE — Telephone Encounter (Signed)
rx sent for prednisone to [facility].

## 2015-12-18 ENCOUNTER — Telehealth: Payer: Self-pay

## 2015-12-18 NOTE — Telephone Encounter (Signed)
Notified pt. 

## 2015-12-18 NOTE — Telephone Encounter (Signed)
Please let her know it isn't the "wrong code", it is just that her insurance is choosing not to pay for that particular code. We can try "low vitamin D" code..Marland Kitchen

## 2016-09-03 ENCOUNTER — Telehealth: Payer: Self-pay | Admitting: Family Medicine

## 2016-09-03 DIAGNOSIS — Z0000 Encounter for general adult medical examination without abnormal findings: Secondary | ICD-10-CM

## 2016-09-03 DIAGNOSIS — R748 Abnormal levels of other serum enzymes: Secondary | ICD-10-CM

## 2016-09-03 DIAGNOSIS — Z1321 Encounter for screening for nutritional disorder: Secondary | ICD-10-CM

## 2016-09-03 NOTE — Telephone Encounter (Signed)
Labs ordered per Pt preference, Pt advised.

## 2016-09-03 NOTE — Telephone Encounter (Signed)
Patient called scheduled appt for physical for 10/02/16 and would like basic blood work and not vitamin D because her insurance does not cover it.

## 2016-09-26 ENCOUNTER — Other Ambulatory Visit: Payer: Self-pay

## 2016-09-26 DIAGNOSIS — Z13220 Encounter for screening for lipoid disorders: Secondary | ICD-10-CM

## 2016-09-26 DIAGNOSIS — Z0000 Encounter for general adult medical examination without abnormal findings: Secondary | ICD-10-CM

## 2016-09-26 DIAGNOSIS — R5383 Other fatigue: Secondary | ICD-10-CM

## 2016-09-28 LAB — CBC WITH DIFFERENTIAL/PLATELET
BASOS PCT: 1.2 %
Basophils Absolute: 59 cells/uL (ref 0–200)
EOS ABS: 142 {cells}/uL (ref 15–500)
Eosinophils Relative: 2.9 %
HEMATOCRIT: 42.4 % (ref 35.0–45.0)
Hemoglobin: 14.3 g/dL (ref 11.7–15.5)
LYMPHS ABS: 1578 {cells}/uL (ref 850–3900)
MCH: 30.6 pg (ref 27.0–33.0)
MCHC: 33.7 g/dL (ref 32.0–36.0)
MCV: 90.6 fL (ref 80.0–100.0)
MPV: 11 fL (ref 7.5–12.5)
Monocytes Relative: 9 %
Neutro Abs: 2680 cells/uL (ref 1500–7800)
Neutrophils Relative %: 54.7 %
PLATELETS: 256 10*3/uL (ref 140–400)
RBC: 4.68 10*6/uL (ref 3.80–5.10)
RDW: 12.3 % (ref 11.0–15.0)
TOTAL LYMPHOCYTE: 32.2 %
WBC: 4.9 10*3/uL (ref 3.8–10.8)
WBCMIX: 441 {cells}/uL (ref 200–950)

## 2016-09-28 LAB — LIPID PANEL W/REFLEX DIRECT LDL
CHOLESTEROL: 150 mg/dL (ref <200)
HDL: 75 mg/dL (ref >50)
LDL Cholesterol (Calc): 61 mg/dL (calc)
Non-HDL Cholesterol (Calc): 75 mg/dL (calc) (ref <130)
Total CHOL/HDL Ratio: 2 (calc) (ref <5.0)
Triglycerides: 63 mg/dL (ref <150)

## 2016-09-28 LAB — COMPLETE METABOLIC PANEL WITH GFR
AG Ratio: 1.7 (calc) (ref 1.0–2.5)
ALBUMIN MSPROF: 4.5 g/dL (ref 3.6–5.1)
ALT: 28 U/L (ref 6–29)
AST: 32 U/L (ref 10–35)
Alkaline phosphatase (APISO): 119 U/L — ABNORMAL HIGH (ref 33–115)
BUN: 9 mg/dL (ref 7–25)
CALCIUM: 9.7 mg/dL (ref 8.6–10.2)
CO2: 30 mmol/L (ref 20–32)
CREATININE: 0.67 mg/dL (ref 0.50–1.10)
Chloride: 103 mmol/L (ref 98–110)
GFR, EST NON AFRICAN AMERICAN: 103 mL/min/{1.73_m2} (ref >=60)
GFR, Est African American: 120 mL/min/{1.73_m2} (ref >=60)
GLOBULIN: 2.6 g/dL (ref 1.9–3.7)
Glucose, Bld: 83 mg/dL (ref 65–99)
Potassium: 3.8 mmol/L (ref 3.5–5.3)
SODIUM: 140 mmol/L (ref 135–146)
Total Bilirubin: 0.5 mg/dL (ref 0.2–1.2)
Total Protein: 7.1 g/dL (ref 6.1–8.1)

## 2016-09-28 LAB — FERRITIN: Ferritin: 100 ng/mL (ref 10–232)

## 2016-09-28 LAB — VITAMIN B12: Vitamin B-12: 1151 pg/mL — ABNORMAL HIGH (ref 200–1100)

## 2016-10-02 ENCOUNTER — Encounter: Payer: Self-pay | Admitting: Obstetrics & Gynecology

## 2016-10-02 ENCOUNTER — Ambulatory Visit (INDEPENDENT_AMBULATORY_CARE_PROVIDER_SITE_OTHER): Payer: BLUE CROSS/BLUE SHIELD | Admitting: Obstetrics & Gynecology

## 2016-10-02 ENCOUNTER — Encounter: Payer: Self-pay | Admitting: Family Medicine

## 2016-10-02 ENCOUNTER — Ambulatory Visit (INDEPENDENT_AMBULATORY_CARE_PROVIDER_SITE_OTHER): Payer: BLUE CROSS/BLUE SHIELD | Admitting: Family Medicine

## 2016-10-02 VITALS — BP 128/76 | HR 71 | Ht 64.0 in | Wt 147.0 lb

## 2016-10-02 VITALS — BP 123/82 | HR 85 | Ht 63.0 in | Wt 147.0 lb

## 2016-10-02 DIAGNOSIS — Z1239 Encounter for other screening for malignant neoplasm of breast: Secondary | ICD-10-CM

## 2016-10-02 DIAGNOSIS — Z0000 Encounter for general adult medical examination without abnormal findings: Secondary | ICD-10-CM | POA: Diagnosis not present

## 2016-10-02 DIAGNOSIS — L2089 Other atopic dermatitis: Secondary | ICD-10-CM

## 2016-10-02 DIAGNOSIS — R002 Palpitations: Secondary | ICD-10-CM

## 2016-10-02 DIAGNOSIS — Z01419 Encounter for gynecological examination (general) (routine) without abnormal findings: Secondary | ICD-10-CM | POA: Diagnosis not present

## 2016-10-02 MED ORDER — HALOBETASOL PROPIONATE 0.05 % EX OINT: TOPICAL_OINTMENT | Freq: Every day | CUTANEOUS | 1 refills | Status: DC | PRN

## 2016-10-02 MED ORDER — HALOBETASOL PROPIONATE 0.05 % EX CREA: TOPICAL_CREAM | CUTANEOUS | 1 refills | Status: DC | PRN

## 2016-10-02 NOTE — Patient Instructions (Signed)

## 2016-10-02 NOTE — Patient Instructions (Addendum)

## 2016-10-02 NOTE — Progress Notes (Signed)
Subjective:     Anna Ray is a 51 y.o. female and is here for a comprehensive physical exam. The patient reports problems - occ feeling of her heart skpping a beath. maybe happend twice a week. not daily. she says it just lasts seconds when it happens. She doesn't get any shortness of breath or chest pain. It does make her cough sometimes.  He denies any known triggers. No recent change in medications, increasing caffeine. Not taking any decongestants or stimulants.  Atopic dermatitis-she would like a refill on her topical steroid cream. His last couple of years ago but she says it works great  Social History   Social History  . Marital status: Married    Spouse name: tim  . Number of children: 1  . Years of education: N/A   Occupational History  . Not on file.   Social History Main Topics  . Smoking status: Former Smoker    Quit date: 11/21/1992  . Smokeless tobacco: Never Used  . Alcohol use No  . Drug use: No  . Sexual activity: Yes    Partners: Male   Other Topics Concern  . Not on file   Social History Narrative   2 cups of caffeine. Regular exercise.           Health Maintenance  Topic Date Due  . HIV Screening  02/10/1982  . PAP SMEAR  06/29/2016  . INFLUENZA VACCINE  10/02/2017 (Originally 08/21/2016)  . TETANUS/TDAP  10/24/2022    The following portions of the patient's history were reviewed and updated as appropriate: allergies, current medications, past family history, past medical history, past social history, past surgical history and problem list.  Review of Systems A comprehensive review of systems was negative.   Objective:    BP 128/76   Pulse 71   Ht 5\' 4"  (1.626 m)   Wt 147 lb (66.7 kg)   LMP 02/20/2012   SpO2 100%   BMI 25.23 kg/m  General appearance: alert, cooperative and appears stated age Head: Normocephalic, without obvious abnormality, atraumatic Eyes: conj clear,. EOMi, PEERLA Ears: normal TM's and external ear canals both  ears Nose: Nares normal. Septum midline. Mucosa normal. No drainage or sinus tenderness. Throat: lips, mucosa, and tongue normal; teeth and gums normal Neck: no adenopathy, no carotid bruit, no JVD, supple, symmetrical, trachea midline and thyroid not enlarged, symmetric, no tenderness/mass/nodules Back: symmetric, no curvature. ROM normal. No CVA tenderness. Lungs: clear to auscultation bilaterally Heart: regular rate and rhythm, S1, S2 normal, no murmur, click, rub or gallop Abdomen: soft, non-tender; bowel sounds normal; no masses,  no organomegaly Extremities: extremities normal, atraumatic, no cyanosis or edema Pulses: 2+ and symmetric Skin: Skin color, texture, turgor normal. No rashes or lesions Lymph nodes: Cervical adenopathy: nl and Axillary adenopathy: nl Neurologic: Alert and oriented X 3, normal strength and tone. Normal symmetric reflexes. Normal coordination and gait    Assessment:    Healthy female exam.      Plan:     See After Visit Summary for Counseling Recommendations   Keep up a regular exercise program and make sure you are eating a healthy diet Try to eat 4 servings of dairy a day, or if you are lactose intolerant take a calcium with vitamin D daily.  Your vaccines are up to date.   Palpitations - I would suspect based on her description that she is getting some brief PVCs or PACs. No other cardiac symptoms and it's very brief.e her  reassurance. Certainly if it starts occurring more frequently then recommend further workup with cardiac monitor, echocardiogram CBC and TSH.  Atopic Dermatitis - will refill her ultravate.

## 2016-10-02 NOTE — Progress Notes (Signed)
Subjective:    Anna Ray is a 51 y.o. female who presents for an annual exam. The patient has no complaints today. The patient is sexually active. GYN screening history: last pap: was normal and last mammogram: approximate date 2016 and was normal. The patient wears seatbelts: yes. The patient participates in regular exercise: yes. Has the patient ever been transfused or tattooed?: not asked. The patient reports that there is not domestic violence in her life.   Menstrual History: OB History    Gravida Para Term Preterm AB Living   1 1 1          SAB TAB Ectopic Multiple Live Births                  Menopausal with no PMB Patient's last menstrual period was 02/20/2012.    The following portions of the patient's history were reviewed and updated as appropriate: allergies, current medications, past family history, past medical history, past social history, past surgical history and problem list.  Review of Systems Pertinent items noted in HPI and remainder of comprehensive ROS otherwise negative.    Objective:      Vitals:   10/02/16 0948  BP: 123/82  Pulse: 85  Weight: 147 lb (66.7 kg)  Height: 5\' 3"  (1.6 m)   Vitals:  WNL General appearance: alert, cooperative and no distress  HEENT: Normocephalic, without obvious abnormality, atraumatic Eyes: negative Throat: lips, mucosa, and tongue normal; teeth and gums normal  Respiratory: Clear to auscultation bilaterally  CV: Regular rate and rhythm  Breasts:  Normal appearance, no masses or tenderness, no nipple retraction or dimpling  GI: Soft, non-tender; bowel sounds normal; no masses,  no organomegaly  GU: External Genitalia:  Tanner V, no lesion Urethra:  No prolapse   Vagina: Pink, normal rugae, no blood or discharge  Cervix: No CMT, no lesion  Uterus:  Normal size and contour, non tender  Adnexa: Normal, no masses, non tender  Musculoskeletal: No edema, redness or tenderness in the calves or thighs  Skin: No  lesions or rash  Lymphatic: Axillary adenopathy: none     Psychiatric: Normal mood and behavior    Assessment:    Healthy female exam.   Menopausal   Plan:    Yearly mammograms Pap with cotesting Calcium worksheet given to determine calcium intake (1200 mg day +800 IU Vit D) Colonoscopy

## 2016-10-03 ENCOUNTER — Encounter: Payer: Self-pay | Admitting: Obstetrics & Gynecology

## 2016-10-04 LAB — CYTOLOGY - PAP
DIAGNOSIS: NEGATIVE
HPV (WINDOPATH): NOT DETECTED

## 2017-10-14 ENCOUNTER — Telehealth: Payer: Self-pay

## 2017-10-14 DIAGNOSIS — Z0000 Encounter for general adult medical examination without abnormal findings: Secondary | ICD-10-CM

## 2017-10-14 DIAGNOSIS — R5383 Other fatigue: Secondary | ICD-10-CM

## 2017-10-14 DIAGNOSIS — R748 Abnormal levels of other serum enzymes: Secondary | ICD-10-CM

## 2017-10-14 NOTE — Telephone Encounter (Signed)
Labs signed.  She can go anytime

## 2017-10-14 NOTE — Telephone Encounter (Signed)
Requesting labs to be ordered prior to her physical. Labs pended, please review.

## 2017-10-15 NOTE — Telephone Encounter (Signed)
Pt advised.

## 2017-10-17 ENCOUNTER — Other Ambulatory Visit: Payer: Self-pay

## 2017-10-17 DIAGNOSIS — R5383 Other fatigue: Secondary | ICD-10-CM

## 2017-10-17 DIAGNOSIS — R748 Abnormal levels of other serum enzymes: Secondary | ICD-10-CM | POA: Diagnosis not present

## 2017-10-17 DIAGNOSIS — Z0000 Encounter for general adult medical examination without abnormal findings: Secondary | ICD-10-CM | POA: Diagnosis not present

## 2017-10-18 LAB — CBC WITH DIFFERENTIAL/PLATELET
Basophils Absolute: 88 cells/uL (ref 0–200)
Basophils Relative: 1.7 %
Eosinophils Absolute: 172 cells/uL (ref 15–500)
Eosinophils Relative: 3.3 %
HCT: 42 % (ref 35.0–45.0)
Hemoglobin: 14.3 g/dL (ref 11.7–15.5)
Lymphs Abs: 1524 cells/uL (ref 850–3900)
MCH: 31.2 pg (ref 27.0–33.0)
MCHC: 34 g/dL (ref 32.0–36.0)
MCV: 91.5 fL (ref 80.0–100.0)
MPV: 11.6 fL (ref 7.5–12.5)
Monocytes Relative: 9.9 %
NEUTROS PCT: 55.8 %
Neutro Abs: 2902 cells/uL (ref 1500–7800)
PLATELETS: 256 10*3/uL (ref 140–400)
RBC: 4.59 10*6/uL (ref 3.80–5.10)
RDW: 12.1 % (ref 11.0–15.0)
TOTAL LYMPHOCYTE: 29.3 %
WBC: 5.2 10*3/uL (ref 3.8–10.8)
WBCMIX: 515 {cells}/uL (ref 200–950)

## 2017-10-18 LAB — COMPLETE METABOLIC PANEL WITH GFR
AG RATIO: 1.7 (calc) (ref 1.0–2.5)
ALT: 18 U/L (ref 6–29)
AST: 21 U/L (ref 10–35)
Albumin: 4.5 g/dL (ref 3.6–5.1)
Alkaline phosphatase (APISO): 116 U/L (ref 33–130)
BILIRUBIN TOTAL: 0.6 mg/dL (ref 0.2–1.2)
BUN: 8 mg/dL (ref 7–25)
CHLORIDE: 104 mmol/L (ref 98–110)
CO2: 31 mmol/L (ref 20–32)
Calcium: 10 mg/dL (ref 8.6–10.4)
Creat: 0.67 mg/dL (ref 0.50–1.05)
GFR, EST AFRICAN AMERICAN: 119 mL/min/{1.73_m2} (ref >=60)
GFR, Est Non African American: 102 mL/min/{1.73_m2} (ref >=60)
GLOBULIN: 2.6 g/dL (ref 1.9–3.7)
Glucose, Bld: 87 mg/dL (ref 65–99)
POTASSIUM: 4.6 mmol/L (ref 3.5–5.3)
SODIUM: 143 mmol/L (ref 135–146)
Total Protein: 7.1 g/dL (ref 6.1–8.1)

## 2017-10-18 LAB — LIPID PANEL W/REFLEX DIRECT LDL
Cholesterol: 155 mg/dL
HDL: 64 mg/dL
LDL Cholesterol (Calc): 74 mg/dL
Non-HDL Cholesterol (Calc): 91 mg/dL
Total CHOL/HDL Ratio: 2.4 (calc)
Triglycerides: 83 mg/dL

## 2017-10-18 LAB — VITAMIN B12: Vitamin B-12: 1224 pg/mL — ABNORMAL HIGH (ref 200–1100)

## 2017-10-18 LAB — TSH: TSH: 1.87 mIU/L

## 2017-10-22 ENCOUNTER — Encounter: Payer: Self-pay | Admitting: Family Medicine

## 2017-10-22 ENCOUNTER — Ambulatory Visit (INDEPENDENT_AMBULATORY_CARE_PROVIDER_SITE_OTHER): Payer: BLUE CROSS/BLUE SHIELD | Admitting: Family Medicine

## 2017-10-22 VITALS — BP 115/61 | HR 66 | Ht 63.0 in | Wt 143.0 lb

## 2017-10-22 DIAGNOSIS — Z0000 Encounter for general adult medical examination without abnormal findings: Secondary | ICD-10-CM

## 2017-10-22 DIAGNOSIS — Z1211 Encounter for screening for malignant neoplasm of colon: Secondary | ICD-10-CM | POA: Diagnosis not present

## 2017-10-22 DIAGNOSIS — Z1231 Encounter for screening mammogram for malignant neoplasm of breast: Secondary | ICD-10-CM

## 2017-10-22 NOTE — Progress Notes (Signed)
Subjective:     Anna Ray is a 52 y.o. female and is here for a comprehensive physical exam. The patient reports no problems.  Social History   Socioeconomic History  . Marital status: Married    Spouse name: tim  . Number of children: 1  . Years of education: Not on file  . Highest education level: Not on file  Occupational History  . Not on file  Social Needs  . Financial resource strain: Not on file  . Food insecurity:    Worry: Not on file    Inability: Not on file  . Transportation needs:    Medical: Not on file    Non-medical: Not on file  Tobacco Use  . Smoking status: Former Smoker    Last attempt to quit: 11/21/1992    Years since quitting: 24.9  . Smokeless tobacco: Never Used  Substance and Sexual Activity  . Alcohol use: No  . Drug use: No  . Sexual activity: Yes    Partners: Male  Lifestyle  . Physical activity:    Days per week: Not on file    Minutes per session: Not on file  . Stress: Not on file  Relationships  . Social connections:    Talks on phone: Not on file    Gets together: Not on file    Attends religious service: Not on file    Active member of club or organization: Not on file    Attends meetings of clubs or organizations: Not on file    Relationship status: Not on file  . Intimate partner violence:    Fear of current or ex partner: Not on file    Emotionally abused: Not on file    Physically abused: Not on file    Forced sexual activity: Not on file  Other Topics Concern  . Not on file  Social History Narrative   2 cups of caffeine. Regular exercise.           Health Maintenance  Topic Date Due  . HIV Screening  02[birth year]10/1982  . MAMMOGRAM  02/10/2017  . COLONOSCOPY  02/10/2017  . INFLUENZA VACCINE  02/19/2019 (Originally 08/21/2017)  . PAP SMEAR  10/03/2019  . TETANUS/TDAP  10/24/2022    The following portions of the patient's history were reviewed and updated as appropriate: allergies, current medications, past family  history, past medical history, past social history, past surgical history and problem list.  Review of Systems A comprehensive review of systems was negative.   Objective:    BP 115/61   Pulse 66   Ht 5\' 3"  (1.6 m)   Wt 143 lb (64.9 kg)   LMP 02/20/2012   SpO2 100%   BMI 25.33 kg/m  General appearance: alert, cooperative and appears stated age Head: Normocephalic, without obvious abnormality, atraumatic Eyes: conj clear, EOMI, PEERLA Ears: normal TM's and external ear canals both ears Nose: Nares normal. Septum midline. Mucosa normal. No drainage or sinus tenderness. Throat: lips, mucosa, and tongue normal; teeth and gums normal Neck: no adenopathy, no carotid bruit, no JVD, supple, symmetrical, trachea midline and thyroid not enlarged, symmetric, no tenderness/mass/nodules Back: symmetric, no curvature. ROM normal. No CVA tenderness. Lungs: clear to auscultation bilaterally Breasts: normal appearance, no masses or tenderness Heart: regular rate and rhythm, S1, S2 normal, no murmur, click, rub or gallop Abdomen: soft, non-tender; bowel sounds normal; no masses,  no organomegaly Extremities: extremities normal, atraumatic, no cyanosis or edema Pulses: 2+ and symmetric Skin: Skin color,  texture, turgor normal. No rashes or lesions Lymph nodes: Cervical, supraclavicular, and axillary nodes normal. Neurologic: Alert and oriented X 3, normal strength and tone. Normal symmetric reflexes. Normal coordination and gait    Assessment:    Healthy female exam.      Plan:     See After Visit Summary for Counseling Recommendations   Keep up a regular exercise program and make sure you are eating a healthy diet Try to eat 4 servings of dairy a day, or if you are lactose intolerant take a calcium with vitamin D daily.  Your vaccines are up to date.   Elevated B12-she is going to cut her multivitamin down to 1 a day instead of 2 a day and we will plan to recheck the B12 in 3 to 4  months.  Cancer screening options discussed.  She is interested in Cologuard.  Information provided she will check with her insurance on coverage  Also discussed shingles vaccine.  Again additional information provided and she will check up with her insurance on coverage.  Encouraged her to schedule her mammogram.

## 2017-10-22 NOTE — Patient Instructions (Signed)
Health Maintenance for Postmenopausal Women Menopause is a normal process in which your reproductive ability comes to an end. This process happens gradually over a span of months to years, usually between the ages of 22 and 9. Menopause is complete when you have missed 12 consecutive menstrual periods. It is important to talk with your health care provider about some of the most common conditions that affect postmenopausal women, such as heart disease, cancer, and bone loss (osteoporosis). Adopting a healthy lifestyle and getting preventive care can help to promote your health and wellness. Those actions can also lower your chances of developing some of these common conditions. What should I know about menopause? During menopause, you may experience a number of symptoms, such as:  Moderate-to-severe hot flashes.  Night sweats.  Decrease in sex drive.  Mood swings.  Headaches.  Tiredness.  Irritability.  Memory problems.  Insomnia.  Choosing to treat or not to treat menopausal changes is an individual decision that you make with your health care provider. What should I know about hormone replacement therapy and supplements? Hormone therapy products are effective for treating symptoms that are associated with menopause, such as hot flashes and night sweats. Hormone replacement carries certain risks, especially as you become older. If you are thinking about using estrogen or estrogen with progestin treatments, discuss the benefits and risks with your health care provider. What should I know about heart disease and stroke? Heart disease, heart attack, and stroke become more likely as you age. This may be due, in part, to the hormonal changes that your body experiences during menopause. These can affect how your body processes dietary fats, triglycerides, and cholesterol. Heart attack and stroke are both medical emergencies. There are many things that you can do to help prevent heart disease  and stroke:  Have your blood pressure checked at least every 1-2 years. High blood pressure causes heart disease and increases the risk of stroke.  If you are 53-22 years old, ask your health care provider if you should take aspirin to prevent a heart attack or a stroke.  Do not use any tobacco products, including cigarettes, chewing tobacco, or electronic cigarettes. If you need help quitting, ask your health care provider.  It is important to eat a healthy diet and maintain a healthy weight. ? Be sure to include plenty of vegetables, fruits, low-fat dairy products, and lean protein. ? Avoid eating foods that are high in solid fats, added sugars, or salt (sodium).  Get regular exercise. This is one of the most important things that you can do for your health. ? Try to exercise for at least 150 minutes each week. The type of exercise that you do should increase your heart rate and make you sweat. This is known as moderate-intensity exercise. ? Try to do strengthening exercises at least twice each week. Do these in addition to the moderate-intensity exercise.  Know your numbers.Ask your health care provider to check your cholesterol and your blood glucose. Continue to have your blood tested as directed by your health care provider.  What should I know about cancer screening? There are several types of cancer. Take the following steps to reduce your risk and to catch any cancer development as early as possible. Breast Cancer  Practice breast self-awareness. ? This means understanding how your breasts normally appear and feel. ? It also means doing regular breast self-exams. Let your health care provider know about any changes, no matter how small.  If you are 40  or older, have a clinician do a breast exam (clinical breast exam or CBE) every year. Depending on your age, family history, and medical history, it may be recommended that you also have a yearly breast X-ray (mammogram).  If you  have a family history of breast cancer, talk with your health care provider about genetic screening.  If you are at high risk for breast cancer, talk with your health care provider about having an MRI and a mammogram every year.  Breast cancer (BRCA) gene test is recommended for women who have family members with BRCA-related cancers. Results of the assessment will determine the need for genetic counseling and BRCA1 and for BRCA2 testing. BRCA-related cancers include these types: ? Breast. This occurs in males or females. ? Ovarian. ? Tubal. This may also be called fallopian tube cancer. ? Cancer of the abdominal or pelvic lining (peritoneal cancer). ? Prostate. ? Pancreatic.  Cervical, Uterine, and Ovarian Cancer Your health care provider may recommend that you be screened regularly for cancer of the pelvic organs. These include your ovaries, uterus, and vagina. This screening involves a pelvic exam, which includes checking for microscopic changes to the surface of your cervix (Pap test).  For women ages 21-65, health care providers may recommend a pelvic exam and a Pap test every three years. For women ages 79-65, they may recommend the Pap test and pelvic exam, combined with testing for human papilloma virus (HPV), every five years. Some types of HPV increase your risk of cervical cancer. Testing for HPV may also be done on women of any age who have unclear Pap test results.  Other health care providers may not recommend any screening for nonpregnant women who are considered low risk for pelvic cancer and have no symptoms. Ask your health care provider if a screening pelvic exam is right for you.  If you have had past treatment for cervical cancer or a condition that could lead to cancer, you need Pap tests and screening for cancer for at least 20 years after your treatment. If Pap tests have been discontinued for you, your risk factors (such as having a new sexual partner) need to be  reassessed to determine if you should start having screenings again. Some women have medical problems that increase the chance of getting cervical cancer. In these cases, your health care provider may recommend that you have screening and Pap tests more often.  If you have a family history of uterine cancer or ovarian cancer, talk with your health care provider about genetic screening.  If you have vaginal bleeding after reaching menopause, tell your health care provider.  There are currently no reliable tests available to screen for ovarian cancer.  Lung Cancer Lung cancer screening is recommended for adults 69-62 years old who are at high risk for lung cancer because of a history of smoking. A yearly low-dose CT scan of the lungs is recommended if you:  Currently smoke.  Have a history of at least 30 pack-years of smoking and you currently smoke or have quit within the past 15 years. A pack-year is smoking an average of one pack of cigarettes per day for one year.  Yearly screening should:  Continue until it has been 15 years since you quit.  Stop if you develop a health problem that would prevent you from having lung cancer treatment.  Colorectal Cancer  This type of cancer can be detected and can often be prevented.  Routine colorectal cancer screening usually begins at  age 42 and continues through age 45.  If you have risk factors for colon cancer, your health care provider may recommend that you be screened at an earlier age.  If you have a family history of colorectal cancer, talk with your health care provider about genetic screening.  Your health care provider may also recommend using home test kits to check for hidden blood in your stool.  A small camera at the end of a tube can be used to examine your colon directly (sigmoidoscopy or colonoscopy). This is done to check for the earliest forms of colorectal cancer.  Direct examination of the colon should be repeated every  5-10 years until age 71. However, if early forms of precancerous polyps or small growths are found or if you have a family history or genetic risk for colorectal cancer, you may need to be screened more often.  Skin Cancer  Check your skin from head to toe regularly.  Monitor any moles. Be sure to tell your health care provider: ? About any new moles or changes in moles, especially if there is a change in a mole's shape or color. ? If you have a mole that is larger than the size of a pencil eraser.  If any of your family members has a history of skin cancer, especially at a young age, talk with your health care provider about genetic screening.  Always use sunscreen. Apply sunscreen liberally and repeatedly throughout the day.  Whenever you are outside, protect yourself by wearing long sleeves, pants, a wide-brimmed hat, and sunglasses.  What should I know about osteoporosis? Osteoporosis is a condition in which bone destruction happens more quickly than new bone creation. After menopause, you may be at an increased risk for osteoporosis. To help prevent osteoporosis or the bone fractures that can happen because of osteoporosis, the following is recommended:  If you are 46-71 years old, get at least 1,000 mg of calcium and at least 600 mg of vitamin D per day.  If you are older than age 55 but younger than age 65, get at least 1,200 mg of calcium and at least 600 mg of vitamin D per day.  If you are older than age 54, get at least 1,200 mg of calcium and at least 800 mg of vitamin D per day.  Smoking and excessive alcohol intake increase the risk of osteoporosis. Eat foods that are rich in calcium and vitamin D, and do weight-bearing exercises several times each week as directed by your health care provider. What should I know about how menopause affects my mental health? Depression may occur at any age, but it is more common as you become older. Common symptoms of depression  include:  Low or sad mood.  Changes in sleep patterns.  Changes in appetite or eating patterns.  Feeling an overall lack of motivation or enjoyment of activities that you previously enjoyed.  Frequent crying spells.  Talk with your health care provider if you think that you are experiencing depression. What should I know about immunizations? It is important that you get and maintain your immunizations. These include:  Tetanus, diphtheria, and pertussis (Tdap) booster vaccine.  Influenza every year before the flu season begins.  Pneumonia vaccine.  Shingles vaccine.  Your health care provider may also recommend other immunizations. This information is not intended to replace advice given to you by your health care provider. Make sure you discuss any questions you have with your health care provider. Document Released: 03/01/2005  Document Revised: 07/28/2015 Document Reviewed: 10/11/2014 Elsevier Interactive Patient Education  2018 [employer] Inc[employer]  

## 2017-10-24 NOTE — Addendum Note (Signed)
Addended by: Deno Etienne on: 10/24/2017 11:09 AM   Modules accepted: Orders

## 2017-12-16 NOTE — Progress Notes (Signed)
Pt needs mammogram.  RN to call and schedule or see if there is one outside of the Epic system / care everywhere

## 2018-02-20 ENCOUNTER — Ambulatory Visit (INDEPENDENT_AMBULATORY_CARE_PROVIDER_SITE_OTHER): Payer: BLUE CROSS/BLUE SHIELD | Admitting: Family Medicine

## 2018-02-20 ENCOUNTER — Encounter: Payer: Self-pay | Admitting: Family Medicine

## 2018-02-20 VITALS — BP 122/75 | HR 85 | Temp 97.6°F | Ht 63.0 in | Wt 147.0 lb

## 2018-02-20 DIAGNOSIS — H9201 Otalgia, right ear: Secondary | ICD-10-CM

## 2018-02-20 DIAGNOSIS — Z1211 Encounter for screening for malignant neoplasm of colon: Secondary | ICD-10-CM

## 2018-02-20 MED ORDER — PREDNISONE 20 MG PO TABS: 40.0000 mg | ORAL_TABLET | Freq: Every day | ORAL | 0 refills | Status: DC

## 2018-02-20 NOTE — Patient Instructions (Signed)
Call if not better in 5-7 days.

## 2018-02-20 NOTE — Progress Notes (Signed)
Acute Office Visit  Subjective:    Patient ID: Anna Ray, female    DOB: 1965/02/13, 53 y.o.   MRN: [id number]  Chief Complaint  Patient presents with  . Ear Pain    x5 days she reports that her sxs started as "ice pick" headache in her R temple she said that she has some R sided jaw/neck pain taking advil. denies f/s/c    HPI Patient is in today for right ear pain that is stabbing that started on Monday. Sys initially her temple was hurting and then moved into her ear aned now is in her jaw. No hearing loss. No diffuculty swallwoing. Using Advil for pain and theT helps. No fever.  + bodyaches.   She does get pain in her TMJ joing sometimes but this feels different.  No hearing loss recently.    Wants to know if there is something she she can take to help prevent arthritis.  He did check into colon cancer screening.  Her insurance will cover Cologuard but if it comes back abnormal and she needs a diagnostic that it will not fall under her preventative and will not be covered until she has met her deductible.  Past Medical History:  Diagnosis Date  . ALLERGIC RHINITIS, SEASONAL 12/03/2005   Qualifier: Diagnosis of  By: Madilyn Fireman MD, Barnetta Chapel    . DERMATITIS, OTHER ATOPIC 05/23/2006   Qualifier: Diagnosis of  By: Madilyn Fireman MD, Barnetta Chapel      Past Surgical History:  Procedure Laterality Date  . APPENDECTOMY    . ENDOMETRIAL ABLATION    . LEFT OOPHORECTOMY    . polyps removed     from cervix    Family History  Problem Relation Age of Onset  . Alcohol abuse Other   . Heart attack Father 65  . Alcohol abuse Father   . Hyperlipidemia Father   . Hypertension Father   . Hyperlipidemia Mother   . Hypertension Mother   . Cancer Mother        Liver  . Diabetes Maternal Aunt   . Heart disease Brother        Pacemaker     Social History   Socioeconomic History  . Marital status: Married    Spouse name: tim  . Number of children: 1  . Years of education: Not on file   . Highest education level: Not on file  Occupational History  . Not on file  Social Needs  . Financial resource strain: Not on file  . Food insecurity:    Worry: Not on file    Inability: Not on file  . Transportation needs:    Medical: Not on file    Non-medical: Not on file  Tobacco Use  . Smoking status: Former Smoker    Last attempt to quit: 11/21/1992    Years since quitting: 25.2  . Smokeless tobacco: Never Used  Substance and Sexual Activity  . Alcohol use: No  . Drug use: No  . Sexual activity: Yes    Partners: Male  Lifestyle  . Physical activity:    Days per week: Not on file    Minutes per session: Not on file  . Stress: Not on file  Relationships  . Social connections:    Talks on phone: Not on file    Gets together: Not on file    Attends religious service: Not on file    Active member of club or organization: Not on file    Attends meetings  of clubs or organizations: Not on file    Relationship status: Not on file  . Intimate partner violence:    Fear of current or ex partner: Not on file    Emotionally abused: Not on file    Physically abused: Not on file    Forced sexual activity: Not on file  Other Topics Concern  . Not on file  Social History Narrative   2 cups of caffeine. Regular exercise.            Outpatient Medications Prior to Visit  Medication Sig Dispense Refill  . Ascorbic Acid (VITAMIN C) 100 MG tablet Take 100 mg by mouth daily.     Marland Kitchen ELDERBERRY PO Take by mouth.    . halobetasol (ULTRAVATE) 0.05 % ointment Apply topically daily as needed. 50 g 1  . Multiple Vitamins-Minerals (CENTRUM SILVER 50+WOMEN) TABS Take 1 tablet by mouth daily.    . Probiotic Product (PROBIOTIC PO) Take by mouth.    . Probiotic Product (PROBIOTIC-10) CHEW Chew by mouth.    . AMBULATORY NON FORMULARY MEDICATION Take 2 capsules by mouth daily. Medication Name:gnc women's ultra mega energy & metabolism     No facility-administered medications prior to  visit.     Allergies  Allergen Reactions  . Montelukast Sodium     REACTION: Severe Headache  . Lanolin Rash    ROS     Objective:    Physical Exam  Constitutional: She is oriented to person, place, and time. She appears well-developed and well-nourished.  HENT:  Head: Normocephalic and atraumatic.  Right Ear: External ear normal.  Left Ear: External ear normal.  Nose: Nose normal.  Mouth/Throat: Oropharynx is clear and moist.  TMs and canals are clear.  He does have some clicking over both TMJ joints with opening and closing the jaw.  Eyes: Pupils are equal, round, and reactive to light. Conjunctivae and EOM are normal.  Neck: Neck supple. No thyromegaly present.  Cardiovascular: Normal rate, regular rhythm and normal heart sounds.  Pulmonary/Chest: Effort normal and breath sounds normal. She has no wheezes.  Musculoskeletal:     Comments: Neck with normal flexion extension rotation and side bending right and left.  Lymphadenopathy:    She has no cervical adenopathy.  Neurological: She is alert and oriented to person, place, and time.  Skin: Skin is warm and dry.  Psychiatric: She has a normal mood and affect.    BP 122/75   Pulse 85   Temp 97.6 F (36.4 C)   Ht 5' 3"  (1.6 m)   Wt 147 lb (66.7 kg)   LMP 02/20/2012   SpO2 100%   BMI 26.04 kg/m  Wt Readings from Last 3 Encounters:  02/20/18 147 lb (66.7 kg)  10/22/17 143 lb (64.9 kg)  10/02/16 147 lb (66.7 kg)    Health Maintenance Due  Topic Date Due  . HIV Screening  02/10/1982  . MAMMOGRAM  02/10/2017  . COLONOSCOPY  02/10/2017    There are no preventive care reminders to display for this patient.   Lab Results  Component Value Date   TSH 1.87 10/17/2017   Lab Results  Component Value Date   WBC 5.2 10/17/2017   HGB 14.3 10/17/2017   HCT 42.0 10/17/2017   MCV 91.5 10/17/2017   PLT 256 10/17/2017   Lab Results  Component Value Date   NA 143 10/17/2017   K 4.6 10/17/2017   CO2 31  10/17/2017   GLUCOSE 87 10/17/2017  BUN 8 10/17/2017   CREATININE 0.67 10/17/2017   BILITOT 0.6 10/17/2017   ALKPHOS 102 10/18/2015   AST 21 10/17/2017   ALT 18 10/17/2017   PROT 7.1 10/17/2017   ALBUMIN 4.4 10/18/2015   CALCIUM 10.0 10/17/2017   Lab Results  Component Value Date   CHOL 155 10/17/2017   Lab Results  Component Value Date   HDL 64 10/17/2017   Lab Results  Component Value Date   LDLCALC 74 10/17/2017   Lab Results  Component Value Date   TRIG 83 10/17/2017   Lab Results  Component Value Date   CHOLHDL 2.4 10/17/2017   Lab Results  Component Value Date   HGBA1C 5.6 06/29/2013       Assessment & Plan:   Problem List Items Addressed This Visit    None    Visit Diagnoses    Right ear pain    -  Primary   Screening for colon cancer       Relevant Orders   Ambulatory referral to Gastroenterology     Right ear pain-radiating to the temple and the jaw-unclear etiology.  Ear exam is normal.  Neck exam is normal.  We discussed differential diagnosis including TMJ, trigeminal nerve inflammation, viral illness etc.  For now we will give her a trial of prednisone for 5 days.  If she is not completely proving then encouraged her to call me back.  Colon Cancer screening-we will refer for gastroenterology referral for colonoscopy.  Patient prefers digestive health here in [place].  Meds ordered this encounter  Medications  . predniSONE (DELTASONE) 20 MG tablet    Sig: Take 2 tablets (40 mg total) by mouth daily with breakfast.    Dispense:  10 tablet    Refill:  0     Beatrice Lecher, MD

## 2018-02-27 ENCOUNTER — Telehealth: Payer: Self-pay

## 2018-02-27 DIAGNOSIS — R6884 Jaw pain: Secondary | ICD-10-CM

## 2018-02-27 NOTE — Telephone Encounter (Signed)
Okay, I am glad that it helped.  Let us try to get her in with ear nose and throat specialist.  Please place referral to patient's preference or [facility] here locally.

## 2018-02-27 NOTE — Telephone Encounter (Signed)
Patient advised of recommendations. She agreed to the referral. She is going to call her insurance company to find out which provider is in network.

## 2018-02-27 NOTE — Telephone Encounter (Signed)
Anna Ray called and stated she took the prednisone through Wednesday. It did help. She states she woke yesterday and today with terrible jaw pain. She was wanting to know what is the next step. Please advise.

## 2018-03-13 DIAGNOSIS — H25812 Combined forms of age-related cataract, left eye: Secondary | ICD-10-CM | POA: Diagnosis not present

## 2018-03-13 DIAGNOSIS — H5000 Unspecified esotropia: Secondary | ICD-10-CM | POA: Diagnosis not present

## 2018-03-13 DIAGNOSIS — H40032 Anatomical narrow angle, left eye: Secondary | ICD-10-CM | POA: Diagnosis not present

## 2018-03-13 DIAGNOSIS — H25811 Combined forms of age-related cataract, right eye: Secondary | ICD-10-CM | POA: Diagnosis not present

## 2018-03-19 DIAGNOSIS — Z1211 Encounter for screening for malignant neoplasm of colon: Secondary | ICD-10-CM | POA: Diagnosis not present

## 2018-03-19 LAB — HM COLONOSCOPY

## 2018-05-08 ENCOUNTER — Telehealth: Payer: Self-pay

## 2018-05-08 MED ORDER — NITROFURANTOIN MONOHYD MACRO 100 MG PO CAPS: 100.0000 mg | ORAL_CAPSULE | Freq: Two times a day (BID) | ORAL | 0 refills | Status: DC

## 2018-05-08 NOTE — Telephone Encounter (Signed)
Patient called stating that earlier in the week she called [facility] and spoke with someone about a vaginal bump she has just outside her urethra. Patient states she has done epsom salt soaks and warm baths and the place has become larger.   Patient also complaining of fullness and lower abdominal cramping. Some burning with urination. Patient states she feels like this when she is developing a UTI.   Patient made aware she was place on scheduled on Monday for evaluation of bump. Patient given antibiotics for UTI since she is unable to be seen until Monday at [place]. Armandina Stammer RN

## 2018-05-11 ENCOUNTER — Encounter: Payer: Self-pay | Admitting: Obstetrics & Gynecology

## 2018-05-11 ENCOUNTER — Other Ambulatory Visit: Payer: Self-pay

## 2018-05-11 ENCOUNTER — Ambulatory Visit (INDEPENDENT_AMBULATORY_CARE_PROVIDER_SITE_OTHER): Payer: BLUE CROSS/BLUE SHIELD | Admitting: Obstetrics & Gynecology

## 2018-05-11 VITALS — BP 116/79 | HR 75 | Ht 63.0 in | Wt 146.0 lb

## 2018-05-11 DIAGNOSIS — R102 Pelvic and perineal pain: Secondary | ICD-10-CM | POA: Diagnosis not present

## 2018-05-11 DIAGNOSIS — N905 Atrophy of vulva: Secondary | ICD-10-CM | POA: Diagnosis not present

## 2018-05-11 NOTE — Progress Notes (Signed)
Pt c/o vaginal bump and pelvic pain

## 2018-05-11 NOTE — Progress Notes (Signed)
   Subjective:    Patient ID: Anna Ray, female    DOB: March 10, 1965, 53 y.o.   MRN: [id number]  HPI 53 yo married P1 here for a vaginal "bump" that feels painful, especially with sex. She has tried epsom salts baths and it has not gotten better. She and her husband have looked at the area and googled it. As of today with exam and a mirror and all positions she is not able to point out the bump. She points to her urethra and says that it was more swollen last week. She has been menopausal for about 5 years. She also reports a 5 day h/o pelvic discomfort.  Review of Systems     Objective:   Physical Exam Breathing, conversing, and ambulating normally Well nourished, well hydrated White female, no apparent distress Abd- benign Vulva- moderate vulvovaginal atrophy, entirely normal findings otherwise in dorsal lithotomy, semi-Fowlers, and standing positions. Bimanual exam entirely normal.     Assessment & Plan:  Urethral area discomfort- I suspect that atrophy is playing a role in this so I have prescribed compounded estrogen 1 gram 3 times per week at bedtime If this is no better in a month, then she will send me a Mychart message and I will give her a referral to urology With regard to the pelvic discomfort and normal bimanual exam, I have offered a gyn u/s and a urine culture. She opts for the culture at this time.

## 2018-05-12 LAB — URINE CULTURE
MICRO NUMBER:: 407208
Result:: NO GROWTH
SPECIMEN QUALITY:: ADEQUATE

## 2018-07-16 ENCOUNTER — Encounter: Payer: Self-pay | Admitting: Family Medicine

## 2018-09-18 DIAGNOSIS — H5000 Unspecified esotropia: Secondary | ICD-10-CM | POA: Diagnosis not present

## 2018-09-18 DIAGNOSIS — H25811 Combined forms of age-related cataract, right eye: Secondary | ICD-10-CM | POA: Diagnosis not present

## 2018-09-18 DIAGNOSIS — H40032 Anatomical narrow angle, left eye: Secondary | ICD-10-CM | POA: Diagnosis not present

## 2018-09-18 DIAGNOSIS — H25812 Combined forms of age-related cataract, left eye: Secondary | ICD-10-CM | POA: Diagnosis not present

## 2018-09-18 DIAGNOSIS — H40031 Anatomical narrow angle, right eye: Secondary | ICD-10-CM | POA: Diagnosis not present

## 2018-09-25 ENCOUNTER — Telehealth: Payer: Self-pay | Admitting: Family Medicine

## 2018-09-25 DIAGNOSIS — Z13220 Encounter for screening for lipoid disorders: Secondary | ICD-10-CM

## 2018-09-25 DIAGNOSIS — Z0000 Encounter for general adult medical examination without abnormal findings: Secondary | ICD-10-CM

## 2018-09-25 DIAGNOSIS — R748 Abnormal levels of other serum enzymes: Secondary | ICD-10-CM

## 2018-09-25 DIAGNOSIS — R5383 Other fatigue: Secondary | ICD-10-CM

## 2018-09-25 NOTE — Telephone Encounter (Signed)
Labs signed

## 2018-09-25 NOTE — Telephone Encounter (Signed)
Labs pended.

## 2018-09-25 NOTE — Telephone Encounter (Signed)
Patient requested labs be put in before her physical

## 2018-09-25 NOTE — Telephone Encounter (Signed)
Pt advised to come for fasting labs

## 2018-10-12 ENCOUNTER — Encounter: Payer: BLUE CROSS/BLUE SHIELD | Admitting: Family Medicine

## 2018-10-27 ENCOUNTER — Other Ambulatory Visit: Payer: Self-pay

## 2018-10-27 DIAGNOSIS — Z0000 Encounter for general adult medical examination without abnormal findings: Secondary | ICD-10-CM

## 2018-10-27 DIAGNOSIS — R748 Abnormal levels of other serum enzymes: Secondary | ICD-10-CM

## 2018-10-27 DIAGNOSIS — R7989 Other specified abnormal findings of blood chemistry: Secondary | ICD-10-CM | POA: Diagnosis not present

## 2018-10-27 DIAGNOSIS — R5383 Other fatigue: Secondary | ICD-10-CM

## 2018-10-27 DIAGNOSIS — Z13220 Encounter for screening for lipoid disorders: Secondary | ICD-10-CM | POA: Diagnosis not present

## 2018-10-28 LAB — CBC WITH DIFFERENTIAL/PLATELET
Absolute Monocytes: 446 cells/uL (ref 200–950)
Basophils Absolute: 59 cells/uL (ref 0–200)
Basophils Relative: 1.3 %
Eosinophils Absolute: 122 cells/uL (ref 15–500)
Eosinophils Relative: 2.7 %
HCT: 41.5 % (ref 35.0–45.0)
Hemoglobin: 13.8 g/dL (ref 11.7–15.5)
Lymphs Abs: 1377 cells/uL (ref 850–3900)
MCH: 30.6 pg (ref 27.0–33.0)
MCHC: 33.3 g/dL (ref 32.0–36.0)
MCV: 92 fL (ref 80.0–100.0)
MPV: 11.5 fL (ref 7.5–12.5)
Monocytes Relative: 9.9 %
Neutro Abs: 2498 cells/uL (ref 1500–7800)
Neutrophils Relative %: 55.5 %
Platelets: 248 10*3/uL (ref 140–400)
RBC: 4.51 10*6/uL (ref 3.80–5.10)
RDW: 12.3 % (ref 11.0–15.0)
Total Lymphocyte: 30.6 %
WBC: 4.5 10*3/uL (ref 3.8–10.8)

## 2018-10-28 LAB — COMPLETE METABOLIC PANEL WITH GFR
AG Ratio: 1.7 (calc) (ref 1.0–2.5)
ALT: 18 U/L (ref 6–29)
AST: 22 U/L (ref 10–35)
Albumin: 4.5 g/dL (ref 3.6–5.1)
Alkaline phosphatase (APISO): 109 U/L (ref 37–153)
BUN: 8 mg/dL (ref 7–25)
CO2: 33 mmol/L — ABNORMAL HIGH (ref 20–32)
Calcium: 9.5 mg/dL (ref 8.6–10.4)
Chloride: 101 mmol/L (ref 98–110)
Creat: 0.65 mg/dL (ref 0.50–1.05)
GFR, Est African American: 119 mL/min/{1.73_m2} (ref >=60)
GFR, Est Non African American: 103 mL/min/{1.73_m2} (ref >=60)
Globulin: 2.7 g/dL (calc) (ref 1.9–3.7)
Glucose, Bld: 86 mg/dL (ref 65–99)
Potassium: 4 mmol/L (ref 3.5–5.3)
Sodium: 139 mmol/L (ref 135–146)
Total Bilirubin: 0.4 mg/dL (ref 0.2–1.2)
Total Protein: 7.2 g/dL (ref 6.1–8.1)

## 2018-10-28 LAB — LIPID PANEL W/REFLEX DIRECT LDL
Cholesterol: 189 mg/dL (ref <200)
HDL: 70 mg/dL (ref >=50)
LDL Cholesterol (Calc): 103 mg/dL (calc) — ABNORMAL HIGH
Non-HDL Cholesterol (Calc): 119 mg/dL (calc) (ref <130)
Total CHOL/HDL Ratio: 2.7 (calc) (ref <5.0)
Triglycerides: 74 mg/dL (ref <150)

## 2018-10-28 LAB — VITAMIN B12: Vitamin B-12: 940 pg/mL (ref 200–1100)

## 2018-10-28 LAB — TSH: TSH: 2.2 mIU/L

## 2018-11-02 ENCOUNTER — Encounter: Payer: BLUE CROSS/BLUE SHIELD | Admitting: Family Medicine

## 2018-11-05 ENCOUNTER — Ambulatory Visit (INDEPENDENT_AMBULATORY_CARE_PROVIDER_SITE_OTHER): Payer: BC Managed Care – PPO | Admitting: Family Medicine

## 2018-11-05 ENCOUNTER — Ambulatory Visit (INDEPENDENT_AMBULATORY_CARE_PROVIDER_SITE_OTHER): Payer: BC Managed Care – PPO

## 2018-11-05 ENCOUNTER — Other Ambulatory Visit: Payer: Self-pay

## 2018-11-05 ENCOUNTER — Encounter: Payer: Self-pay | Admitting: Family Medicine

## 2018-11-05 VITALS — BP 120/75 | HR 79 | Ht 63.0 in | Wt 153.0 lb

## 2018-11-05 DIAGNOSIS — Z1231 Encounter for screening mammogram for malignant neoplasm of breast: Secondary | ICD-10-CM

## 2018-11-05 DIAGNOSIS — Z0000 Encounter for general adult medical examination without abnormal findings: Secondary | ICD-10-CM | POA: Diagnosis not present

## 2018-11-05 DIAGNOSIS — J301 Allergic rhinitis due to pollen: Secondary | ICD-10-CM

## 2018-11-05 DIAGNOSIS — R635 Abnormal weight gain: Secondary | ICD-10-CM

## 2018-11-05 DIAGNOSIS — R0789 Other chest pain: Secondary | ICD-10-CM

## 2018-11-05 DIAGNOSIS — Z23 Encounter for immunization: Secondary | ICD-10-CM

## 2018-11-05 DIAGNOSIS — L2089 Other atopic dermatitis: Secondary | ICD-10-CM

## 2018-11-05 DIAGNOSIS — Z6827 Body mass index (BMI) 27.0-27.9, adult: Secondary | ICD-10-CM

## 2018-11-05 MED ORDER — PHENTERMINE HCL 15 MG PO CAPS: 15.0000 mg | ORAL_CAPSULE | ORAL | 0 refills | Status: DC

## 2018-11-05 MED ORDER — MONTELUKAST SODIUM 10 MG PO TABS: 10.0000 mg | ORAL_TABLET | Freq: Every day | ORAL | 3 refills | Status: DC

## 2018-11-05 MED ORDER — HALOBETASOL PROPIONATE 0.05 % EX OINT: TOPICAL_OINTMENT | Freq: Every day | CUTANEOUS | 1 refills | Status: DC | PRN

## 2018-11-05 NOTE — Patient Instructions (Signed)
Health Maintenance, Female Adopting a healthy lifestyle and getting preventive care are important in promoting health and wellness. Ask your health care provider about:  The right schedule for you to have regular tests and exams.  Things you can do on your own to prevent diseases and keep yourself healthy. What should I know about diet, weight, and exercise? Eat a healthy diet   Eat a diet that includes plenty of vegetables, fruits, low-fat dairy products, and lean protein.  Do not eat a lot of foods that are high in solid fats, added sugars, or sodium. Maintain a healthy weight Body mass index (BMI) is used to identify weight problems. It estimates body fat based on height and weight. Your health care provider can help determine your BMI and help you achieve or maintain a healthy weight. Get regular exercise Get regular exercise. This is one of the most important things you can do for your health. Most adults should:  Exercise for at least 150 minutes each week. The exercise should increase your heart rate and make you sweat (moderate-intensity exercise).  Do strengthening exercises at least twice a week. This is in addition to the moderate-intensity exercise.  Spend less time sitting. Even light physical activity can be beneficial. Watch cholesterol and blood lipids Have your blood tested for lipids and cholesterol at 51 years of age, then have this test every 5 years. Have your cholesterol levels checked more often if:  Your lipid or cholesterol levels are high.  You are older than 51 years of age.  You are at high risk for heart disease. What should I know about cancer screening? Depending on your health history and family history, you may need to have cancer screening at various ages. This may include screening for:  Breast cancer.  Cervical cancer.  Colorectal cancer.  Skin cancer.  Lung cancer. What should I know about heart disease, diabetes, and high blood  pressure? Blood pressure and heart disease  High blood pressure causes heart disease and increases the risk of stroke. This is more likely to develop in people who have high blood pressure readings, are of African descent, or are overweight.  Have your blood pressure checked: ? Every 3-5 years if you are 18-39 years of age. ? Every year if you are 40 years old or older. Diabetes Have regular diabetes screenings. This checks your fasting blood sugar level. Have the screening done:  Once every three years after age 40 if you are at a normal weight and have a low risk for diabetes.  More often and at a younger age if you are overweight or have a high risk for diabetes. What should I know about preventing infection? Hepatitis B If you have a higher risk for hepatitis B, you should be screened for this virus. Talk with your health care provider to find out if you are at risk for hepatitis B infection. Hepatitis C Testing is recommended for:  Everyone born from 1945 through 1965.  Anyone with known risk factors for hepatitis C. Sexually transmitted infections (STIs)  Get screened for STIs, including gonorrhea and chlamydia, if: ? You are sexually active and are younger than 51 years of age. ? You are older than 51 years of age and your health care provider tells you that you are at risk for this type of infection. ? Your sexual activity has changed since you were last screened, and you are at increased risk for chlamydia or gonorrhea. Ask your health care provider if   you are at risk.  Ask your health care provider about whether you are at high risk for HIV. Your health care provider may recommend a prescription medicine to help prevent HIV infection. If you choose to take medicine to prevent HIV, you should first get tested for HIV. You should then be tested every 3 months for as long as you are taking the medicine. Pregnancy  If you are about to stop having your period (premenopausal) and  you may become pregnant, seek counseling before you get pregnant.  Take 400 to 800 micrograms (mcg) of folic acid every day if you become pregnant.  Ask for birth control (contraception) if you want to prevent pregnancy. Osteoporosis and menopause Osteoporosis is a disease in which the bones lose minerals and strength with aging. This can result in bone fractures. If you are 65 years old or older, or if you are at risk for osteoporosis and fractures, ask your health care provider if you should:  Be screened for bone loss.  Take a calcium or vitamin D supplement to lower your risk of fractures.  Be given hormone replacement therapy (HRT) to treat symptoms of menopause. Follow these instructions at home: Lifestyle  Do not use any products that contain nicotine or tobacco, such as cigarettes, e-cigarettes, and chewing tobacco. If you need help quitting, ask your health care provider.  Do not use street drugs.  Do not share needles.  Ask your health care provider for help if you need support or information about quitting drugs. Alcohol use  Do not drink alcohol if: ? Your health care provider tells you not to drink. ? You are pregnant, may be pregnant, or are planning to become pregnant.  If you drink alcohol: ? Limit how much you use to 0-1 drink a day. ? Limit intake if you are breastfeeding.  Be aware of how much alcohol is in your drink. In the [place] one drink equals one 12 oz bottle of beer (355 mL), one 5 oz glass of wine (148 mL), or one 1 oz glass of hard liquor (44 mL). General instructions  Schedule regular health, dental, and eye exams.  Stay current with your vaccines.  Tell your health care provider if: ? You often feel depressed. ? You have ever been abused or do not feel safe at home. Summary  Adopting a healthy lifestyle and getting preventive care are important in promoting health and wellness.  Follow your health care provider's instructions about healthy  diet, exercising, and getting tested or screened for diseases.  Follow your health care provider's instructions on monitoring your cholesterol and blood pressure. This information is not intended to replace advice given to you by your health care provider. Make sure you discuss any questions you have with your health care provider. Document Released: 07/23/2010 Document Revised: 12/31/2017 Document Reviewed: 12/31/2017 Elsevier Patient Education  2020 [employer] Inc[employer]  

## 2018-11-05 NOTE — Progress Notes (Signed)
Subjective:     Anna Ray is a 53 y.o. female and is here for a comprehensive physical exam. The patient reports no problems. Not exercising as much as she used to .  She overall is doing well and is sleeping well.  She is overdue for her mammogram last one was 4 years ago.  She is asking for refill on her steroid cream for her atopic dermatitis.  She also has a couple of other concerns today:  She notes that twice a year she gets allergies usually in the spring and the fall and will feel like a little bit of heaviness in her chest like it is just a little bit harder to breathe.  She does not experience any pain, coughing or wheezing.  But she does feel like her allergies affect her breathing.  No other worsening or alleviating factors.  She also wanted to discuss her weight.  In the last year or 2 she is worked hard to eat a healthy diet she is vegetarian.  She reports that she tries to exercise regularly though she has fallen off a little bit with that since Covid hit she has not been quite as consistent.  He just feels like she is getting frustrated and spinning her wheels and just is not really getting a whole lot better.  Would like to consider a prescription medication to help kick start/boost her metabolism to help her lose weight.  Social History   Socioeconomic History  . Marital status: Married    Spouse name: tim  . Number of children: 1  . Years of education: Not on file  . Highest education level: Not on file  Occupational History  . Not on file  Social Needs  . Financial resource strain: Not on file  . Food insecurity    Worry: Not on file    Inability: Not on file  . Transportation needs    Medical: Not on file    Non-medical: Not on file  Tobacco Use  . Smoking status: Former Smoker    Quit date: 11/21/1992    Years since quitting: 25.9  . Smokeless tobacco: Never Used  Substance and Sexual Activity  . Alcohol use: No  . Drug use: No  . Sexual activity: Yes     Partners: Male  Lifestyle  . Physical activity    Days per week: Not on file    Minutes per session: Not on file  . Stress: Not on file  Relationships  . Social Herbalist on phone: Not on file    Gets together: Not on file    Attends religious service: Not on file    Active member of club or organization: Not on file    Attends meetings of clubs or organizations: Not on file    Relationship status: Not on file  . Intimate partner violence    Fear of current or ex partner: Not on file    Emotionally abused: Not on file    Physically abused: Not on file    Forced sexual activity: Not on file  Other Topics Concern  . Not on file  Social History Narrative   2 cups of caffeine. Regular exercise.           Health Maintenance  Topic Date Due  . MAMMOGRAM  02/10/2017  . INFLUENZA VACCINE  04/21/2019 (Originally 08/22/2018)  . HIV Screening  11/05/2019 (Originally [birth year]/10/1982)  . PAP SMEAR-Modifier  10/02/2021  . TETANUS/TDAP  10/24/2022  . COLONOSCOPY  03/19/2028    The following portions of the patient's history were reviewed and updated as appropriate: allergies, current medications, past family history, past medical history, past social history, past surgical history and problem list.  Review of Systems A comprehensive review of systems was negative.   Objective:    BP 120/75   Pulse 79   Ht 5\' 3"  (1.6 m)   Wt 153 lb (69.4 kg)   LMP 02/20/2012   SpO2 100%   BMI 27.10 kg/m  General appearance: alert, cooperative and appears stated age Head: Normocephalic, without obvious abnormality, atraumatic Eyes: conj clear, EOMI, PEERLA Ears: normal TM's and external ear canals both ears Nose: Nares normal. Septum midline. Mucosa normal. No drainage or sinus tenderness. Throat: lips, mucosa, and tongue normal; teeth and gums normal Neck: no adenopathy, no carotid bruit, no JVD, supple, symmetrical, trachea midline and thyroid not enlarged, symmetric, no  tenderness/mass/nodules Back: symmetric, no curvature. ROM normal. No CVA tenderness. Lungs: clear to auscultation bilaterally Breasts: normal appearance, no masses or tenderness Heart: regular rate and rhythm, S1, S2 normal, no murmur, click, rub or gallop Abdomen: soft, non-tender; bowel sounds normal; no masses,  no organomegaly Pelvic: deferred Extremities: extremities normal, atraumatic, no cyanosis or edema Pulses: 2+ and symmetric Skin: Skin color, texture, turgor normal. No rashes or lesions Lymph nodes: Cervical, supraclavicular, and axillary nodes normal. Neurologic: Alert and oriented X 3, normal strength and tone. Normal symmetric reflexes. Normal coordination and gait    Assessment:    Healthy female exam.      Plan:     See After Visit Summary for Counseling Recommendations   complete physical examination  Keep up a regular exercise program and make sure you are eating a healthy diet Try to eat 4 servings of dairy a day, or if you are lactose intolerant take a calcium with vitamin D daily.  Your vaccines are up to date.  Flu vaccine given today.  Tetanus is up-to-date.  Given information regarding the shingles vaccine. We will get her mammogram done today.  Order placed.  Atopic dermatitis-we will go ahead and refill her steroid ointment today.  Allergic rhinitis-it sounds like she most get some type of chest component may be even a little bronchospasm though she is not getting actual wheezing.  We discussed maybe a trial of Singulair seasonally to see if this helps.  If not then consider further work-up with spirometry.  BMI 27/weight gain-she would like to consider prescription options.  We discussed phentermine versus some of the newer options that have more long-term safety and efficacy.  She would actually like to try phentermine for couple months.  Did warn about potential side effects including chest pain pressure palpitations insomnia and increase in blood  pressure.  These will have to be monitored carefully.  We will start with 15 mg daily.  Continue to work on healthy diet try to get back into her regular exercise routine.  We will see her back in 1 month.

## 2018-11-09 ENCOUNTER — Other Ambulatory Visit: Payer: Self-pay | Admitting: Family Medicine

## 2018-11-09 DIAGNOSIS — R928 Other abnormal and inconclusive findings on diagnostic imaging of breast: Secondary | ICD-10-CM

## 2018-11-13 ENCOUNTER — Ambulatory Visit: Payer: BC Managed Care – PPO

## 2018-11-13 ENCOUNTER — Ambulatory Visit
Admission: RE | Admit: 2018-11-13 | Discharge: 2018-11-13 | Disposition: A | Discharge status: Home / Self Care | Payer: BC Managed Care – PPO | Source: Ambulatory Visit | Attending: Family Medicine | Admitting: Family Medicine

## 2018-11-13 ENCOUNTER — Other Ambulatory Visit: Payer: Self-pay

## 2018-11-13 DIAGNOSIS — R922 Inconclusive mammogram: Secondary | ICD-10-CM | POA: Diagnosis not present

## 2018-11-13 DIAGNOSIS — R928 Other abnormal and inconclusive findings on diagnostic imaging of breast: Secondary | ICD-10-CM

## 2019-01-06 NOTE — Progress Notes (Signed)
Patient called and her husband was exposed and tested positive 2 days ago. She has not had any symptoms at this time and was advised that if she started having symptoms to call back for a virtual. Per PCP recommendation the patient would need to wait at least 3-5 days before being tested with no symptoms. She was advised on the symptoms to look out for and when to seek medical care. No other questions at this time. She declined a virtual visit at this time.

## 2019-01-27 ENCOUNTER — Other Ambulatory Visit: Payer: Self-pay | Admitting: Family Medicine

## 2019-02-25 DIAGNOSIS — R748 Abnormal levels of other serum enzymes: Secondary | ICD-10-CM | POA: Insufficient documentation

## 2019-02-25 DIAGNOSIS — R7989 Other specified abnormal findings of blood chemistry: Secondary | ICD-10-CM | POA: Insufficient documentation

## 2019-12-02 ENCOUNTER — Telehealth: Payer: Self-pay | Admitting: Family Medicine

## 2019-12-02 DIAGNOSIS — Z131 Encounter for screening for diabetes mellitus: Secondary | ICD-10-CM

## 2019-12-02 DIAGNOSIS — R5383 Other fatigue: Secondary | ICD-10-CM

## 2019-12-02 DIAGNOSIS — Z0000 Encounter for general adult medical examination without abnormal findings: Secondary | ICD-10-CM

## 2019-12-02 DIAGNOSIS — Z13220 Encounter for screening for lipoid disorders: Secondary | ICD-10-CM

## 2019-12-02 NOTE — Telephone Encounter (Signed)
Labs signed off. She can go at her convenience.

## 2019-12-02 NOTE — Telephone Encounter (Signed)
Labs pended. Please review and add labs if necessary.

## 2019-12-02 NOTE — Telephone Encounter (Signed)
Patient would like labs called in before physical.

## 2019-12-03 NOTE — Telephone Encounter (Signed)
Left message advising of orders

## 2019-12-20 ENCOUNTER — Encounter: Payer: BC Managed Care – PPO | Admitting: Family Medicine

## 2020-01-03 ENCOUNTER — Ambulatory Visit (INDEPENDENT_AMBULATORY_CARE_PROVIDER_SITE_OTHER): Payer: BC Managed Care – PPO | Admitting: Family Medicine

## 2020-01-03 ENCOUNTER — Encounter: Payer: Self-pay | Admitting: Family Medicine

## 2020-01-03 ENCOUNTER — Other Ambulatory Visit: Payer: Self-pay

## 2020-01-03 VITALS — BP 118/68 | HR 77 | Ht 63.0 in | Wt 161.0 lb

## 2020-01-03 DIAGNOSIS — Z0000 Encounter for general adult medical examination without abnormal findings: Secondary | ICD-10-CM | POA: Diagnosis not present

## 2020-01-03 NOTE — Progress Notes (Addendum)
Subjective:     Anna Ray is a 54 y.o. female and is here for a comprehensive physical exam. The patient reports no problems.  Does occasionally feel like it is hard to get a deep breath then sort of at the top of her chest.  She says it just seems random and intermittent does not stop her from exercising etc.  Social History   Socioeconomic History  . Marital status: Married    Spouse name: tim  . Number of children: 1  . Years of education: Not on file  . Highest education level: Not on file  Occupational History  . Not on file  Tobacco Use  . Smoking status: Former Smoker    Quit date: 11/21/1992    Years since quitting: 27.1  . Smokeless tobacco: Never Used  Substance and Sexual Activity  . Alcohol use: No  . Drug use: No  . Sexual activity: Yes    Partners: Male  Other Topics Concern  . Not on file  Social History Narrative   2 cups of caffeine. Regular exercise.           Social Determinants of Health   Financial Resource Strain: Not on file  Food Insecurity: Not on file  Transportation Needs: Not on file  Physical Activity: Not on file  Stress: Not on file  Social Connections: Not on file  Intimate Partner Violence: Not on file   Health Maintenance  Topic Date Due  . Hepatitis C Screening  Never done  . COVID-19 Vaccine (1) Never done  . HIV Screening  Never done  . INFLUENZA VACCINE  08/22/2019  . MAMMOGRAM  11/12/2020  . PAP SMEAR-Modifier  10/02/2021  . TETANUS/TDAP  10/24/2022  . COLONOSCOPY  03/19/2028    The following portions of the patient's history were reviewed and updated as appropriate: allergies, current medications, past family history, past medical history, past social history, past surgical history and problem list.  Review of Systems A comprehensive review of systems was negative.   Objective:    BP 118/68   Pulse 77   Ht 5\' 3"  (1.6 m)   Wt 161 lb (73 kg)   LMP 02/20/2012   SpO2 98%   BMI 28.52 kg/m  General  appearance: alert, cooperative and appears stated age Head: Normocephalic, without obvious abnormality, atraumatic Eyes: conj clear, EOMi, PEERLA Ears: normal TM's and external ear canals both ears Nose: Nares normal. Septum midline. Mucosa normal. No drainage or sinus tenderness. Throat: lips, mucosa, and tongue normal; teeth and gums normal Neck: no adenopathy, no carotid bruit, no JVD, supple, symmetrical, trachea midline and thyroid not enlarged, symmetric, no tenderness/mass/nodules, + small LN at the base of the right ear.. Back: symmetric, no curvature. ROM normal. No CVA tenderness. Lungs: clear to auscultation bilaterally Breasts: normal appearance, no masses or tenderness Heart: regular rate and rhythm, S1, S2 normal, no murmur, click, rub or gallop Abdomen: soft, non-tender; bowel sounds normal; no masses,  no organomegaly Extremities: extremities normal, atraumatic, no cyanosis or edema Pulses: 2+ and symmetric Skin: Skin color, texture, turgor normal. No rashes or lesions Lymph nodes: Cervical, supraclavicular, and axillary nodes normal. Neurologic: Alert and oriented X 3, normal strength and tone. Normal symmetric reflexes. Normal coordination and gait    Assessment:    Healthy female exam.      Plan:     See After Visit Summary for Counseling Recommendations   Keep up a regular exercise program and make sure you are eating  a healthy diet Try to eat 4 servings of dairy a day, or if you are lactose intolerant take a calcium with vitamin D daily.  Your vaccines are up to date.   Sensation of having hard time taking a deep breath.  Unclear etiology discussed could be reflux triggered.  She says the Singulair is not really helping so she actually quit taking it.  We also discussed possible referral to pulmonology for possible pulmonary function testing.  She will consider it and let me know of in the future if she would like to move forward with referral.

## 2020-01-03 NOTE — Patient Instructions (Signed)
Health Maintenance, Female Adopting a healthy lifestyle and getting preventive care are important in promoting health and wellness. Ask your health care provider about:  The right schedule for you to have regular tests and exams.  Things you can do on your own to prevent diseases and keep yourself healthy. What should I know about diet, weight, and exercise? Eat a healthy diet   Eat a diet that includes plenty of vegetables, fruits, low-fat dairy products, and lean protein.  Do not eat a lot of foods that are high in solid fats, added sugars, or sodium. Maintain a healthy weight Body mass index (BMI) is used to identify weight problems. It estimates body fat based on height and weight. Your health care provider can help determine your BMI and help you achieve or maintain a healthy weight. Get regular exercise Get regular exercise. This is one of the most important things you can do for your health. Most adults should:  Exercise for at least 150 minutes each week. The exercise should increase your heart rate and make you sweat (moderate-intensity exercise).  Do strengthening exercises at least twice a week. This is in addition to the moderate-intensity exercise.  Spend less time sitting. Even light physical activity can be beneficial. Watch cholesterol and blood lipids Have your blood tested for lipids and cholesterol at 54 years of age, then have this test every 5 years. Have your cholesterol levels checked more often if:  Your lipid or cholesterol levels are high.  You are older than 52 years of age.  You are at high risk for heart disease. What should I know about cancer screening? Depending on your health history and family history, you may need to have cancer screening at various ages. This may include screening for:  Breast cancer.  Cervical cancer.  Colorectal cancer.  Skin cancer.  Lung cancer. What should I know about heart disease, diabetes, and high blood  pressure? Blood pressure and heart disease  High blood pressure causes heart disease and increases the risk of stroke. This is more likely to develop in people who have high blood pressure readings, are of African descent, or are overweight.  Have your blood pressure checked: ? Every 3-5 years if you are 18-39 years of age. ? Every year if you are 40 years old or older. Diabetes Have regular diabetes screenings. This checks your fasting blood sugar level. Have the screening done:  Once every three years after age 40 if you are at a normal weight and have a low risk for diabetes.  More often and at a younger age if you are overweight or have a high risk for diabetes. What should I know about preventing infection? Hepatitis B If you have a higher risk for hepatitis B, you should be screened for this virus. Talk with your health care provider to find out if you are at risk for hepatitis B infection. Hepatitis C Testing is recommended for:  Everyone born from 1945 through 1965.  Anyone with known risk factors for hepatitis C. Sexually transmitted infections (STIs)  Get screened for STIs, including gonorrhea and chlamydia, if: ? You are sexually active and are younger than 52 years of age. ? You are older than 52 years of age and your health care provider tells you that you are at risk for this type of infection. ? Your sexual activity has changed since you were last screened, and you are at increased risk for chlamydia or gonorrhea. Ask your health care provider if   you are at risk.  Ask your health care provider about whether you are at high risk for HIV. Your health care provider may recommend a prescription medicine to help prevent HIV infection. If you choose to take medicine to prevent HIV, you should first get tested for HIV. You should then be tested every 3 months for as long as you are taking the medicine. Pregnancy  If you are about to stop having your period (premenopausal) and  you may become pregnant, seek counseling before you get pregnant.  Take 400 to 800 micrograms (mcg) of folic acid every day if you become pregnant.  Ask for birth control (contraception) if you want to prevent pregnancy. Osteoporosis and menopause Osteoporosis is a disease in which the bones lose minerals and strength with aging. This can result in bone fractures. If you are 65 years old or older, or if you are at risk for osteoporosis and fractures, ask your health care provider if you should:  Be screened for bone loss.  Take a calcium or vitamin D supplement to lower your risk of fractures.  Be given hormone replacement therapy (HRT) to treat symptoms of menopause. Follow these instructions at home: Lifestyle  Do not use any products that contain nicotine or tobacco, such as cigarettes, e-cigarettes, and chewing tobacco. If you need help quitting, ask your health care provider.  Do not use street drugs.  Do not share needles.  Ask your health care provider for help if you need support or information about quitting drugs. Alcohol use  Do not drink alcohol if: ? Your health care provider tells you not to drink. ? You are pregnant, may be pregnant, or are planning to become pregnant.  If you drink alcohol: ? Limit how much you use to 0-1 drink a day. ? Limit intake if you are breastfeeding.  Be aware of how much alcohol is in your drink. In the [place] one drink equals one 12 oz bottle of beer (355 mL), one 5 oz glass of wine (148 mL), or one 1 oz glass of hard liquor (44 mL). General instructions  Schedule regular health, dental, and eye exams.  Stay current with your vaccines.  Tell your health care provider if: ? You often feel depressed. ? You have ever been abused or do not feel safe at home. Summary  Adopting a healthy lifestyle and getting preventive care are important in promoting health and wellness.  Follow your health care provider's instructions about healthy  diet, exercising, and getting tested or screened for diseases.  Follow your health care provider's instructions on monitoring your cholesterol and blood pressure. This information is not intended to replace advice given to you by your health care provider. Make sure you discuss any questions you have with your health care provider. Document Revised: 12/31/2017 Document Reviewed: 12/31/2017 Elsevier Patient Education  2020 [employer]  

## 2020-01-10 ENCOUNTER — Telehealth: Payer: Self-pay | Admitting: Family Medicine

## 2020-01-10 ENCOUNTER — Encounter: Payer: Self-pay | Admitting: Family Medicine

## 2020-01-10 NOTE — Telephone Encounter (Signed)
Pt called and left voicemail stating that they are coming to lab tomorrow morning 01/11/20 and she wants to make sure these labs have been released and Lab can see the order and that you can call her to confirm this

## 2020-01-11 DIAGNOSIS — Z0000 Encounter for general adult medical examination without abnormal findings: Secondary | ICD-10-CM | POA: Diagnosis not present

## 2020-01-11 LAB — COMPLETE METABOLIC PANEL WITH GFR
AG Ratio: 2 (calc) (ref 1.0–2.5)
ALT: 13 U/L (ref 6–29)
AST: 17 U/L (ref 10–35)
Albumin: 4.4 g/dL (ref 3.6–5.1)
Alkaline phosphatase (APISO): 115 U/L (ref 37–153)
BUN: 14 mg/dL (ref 7–25)
CO2: 32 mmol/L (ref 20–32)
Calcium: 9.5 mg/dL (ref 8.6–10.4)
Chloride: 102 mmol/L (ref 98–110)
Creat: 0.67 mg/dL (ref 0.50–1.05)
GFR, Est African American: 117 mL/min/{1.73_m2} (ref >=60)
GFR, Est Non African American: 101 mL/min/{1.73_m2} (ref >=60)
Globulin: 2.2 g/dL (calc) (ref 1.9–3.7)
Glucose, Bld: 82 mg/dL (ref 65–99)
Potassium: 4.4 mmol/L (ref 3.5–5.3)
Sodium: 140 mmol/L (ref 135–146)
Total Bilirubin: 0.4 mg/dL (ref 0.2–1.2)
Total Protein: 6.6 g/dL (ref 6.1–8.1)

## 2020-01-11 LAB — CBC
HCT: 41.9 % (ref 35.0–45.0)
Hemoglobin: 14.2 g/dL (ref 11.7–15.5)
MCH: 31.3 pg (ref 27.0–33.0)
MCHC: 33.9 g/dL (ref 32.0–36.0)
MCV: 92.3 fL (ref 80.0–100.0)
MPV: 10.9 fL (ref 7.5–12.5)
Platelets: 293 10*3/uL (ref 140–400)
RBC: 4.54 10*6/uL (ref 3.80–5.10)
RDW: 11.9 % (ref 11.0–15.0)
WBC: 5.6 10*3/uL (ref 3.8–10.8)

## 2020-01-11 LAB — LIPID PANEL
Cholesterol: 189 mg/dL (ref <200)
HDL: 68 mg/dL (ref >=50)
LDL Cholesterol (Calc): 104 mg/dL (calc) — ABNORMAL HIGH
Non-HDL Cholesterol (Calc): 121 mg/dL (calc) (ref <130)
Total CHOL/HDL Ratio: 2.8 (calc) (ref <5.0)
Triglycerides: 79 mg/dL (ref <150)

## 2020-01-11 LAB — TSH: TSH: 3.23 mIU/L

## 2020-01-11 NOTE — Telephone Encounter (Signed)
This was handled by Marylene Land T.

## 2020-01-20 IMAGING — MG DIGITAL DIAGNOSTIC BILAT W/ TOMO W/ CAD
6 of 10 series · 6 of 30 positions shown · non-contrast
Comparison: Previous exams including recent screening mammogram
dated 11/05/2018.

CLINICAL DATA: Patient returns today to evaluate a possible LEFT
breast asymmetry and a possible RIGHT breast distortion identified
on recent screening mammogram.

EXAM:
DIGITAL DIAGNOSTIC BILATERAL MAMMOGRAM WITH CAD AND TOMO

[L CC synth-2D]
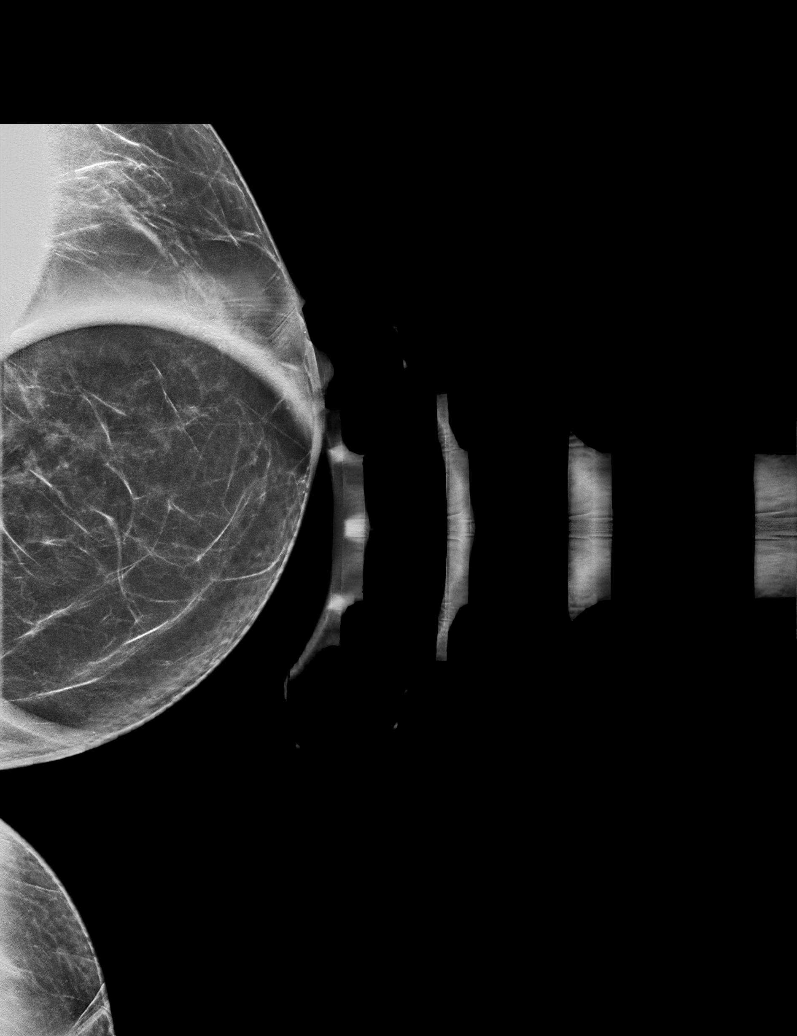

[R ML synth-2D (1 of 2)]
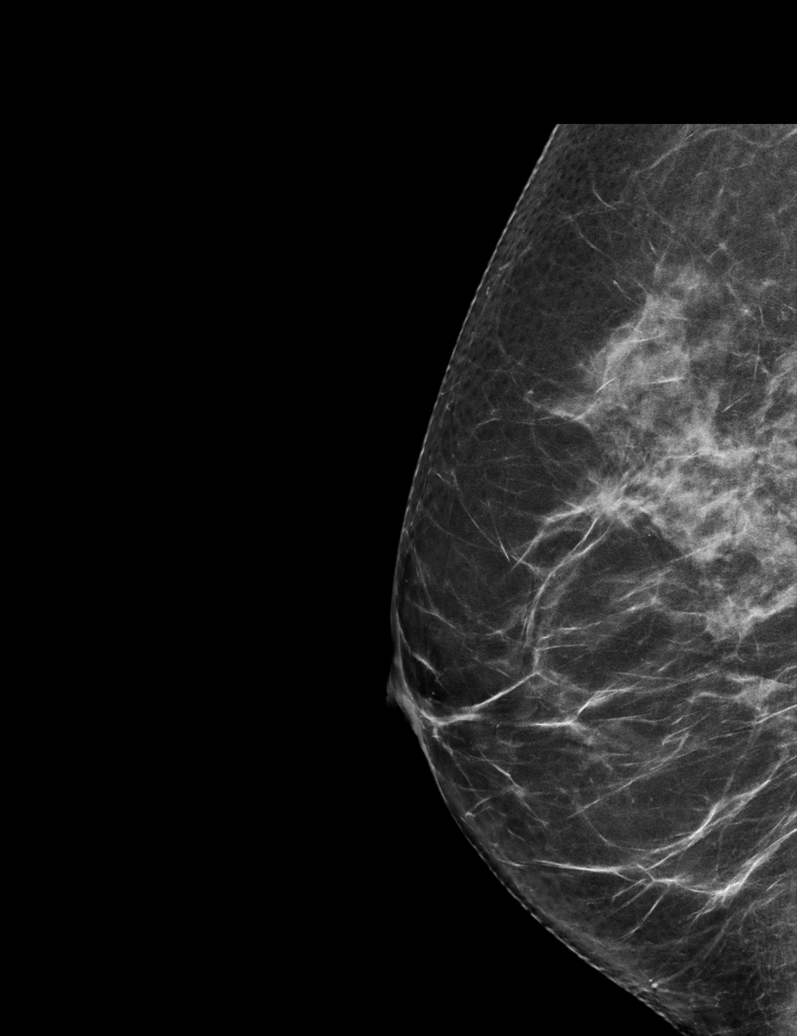

[R MLO synth-2D]
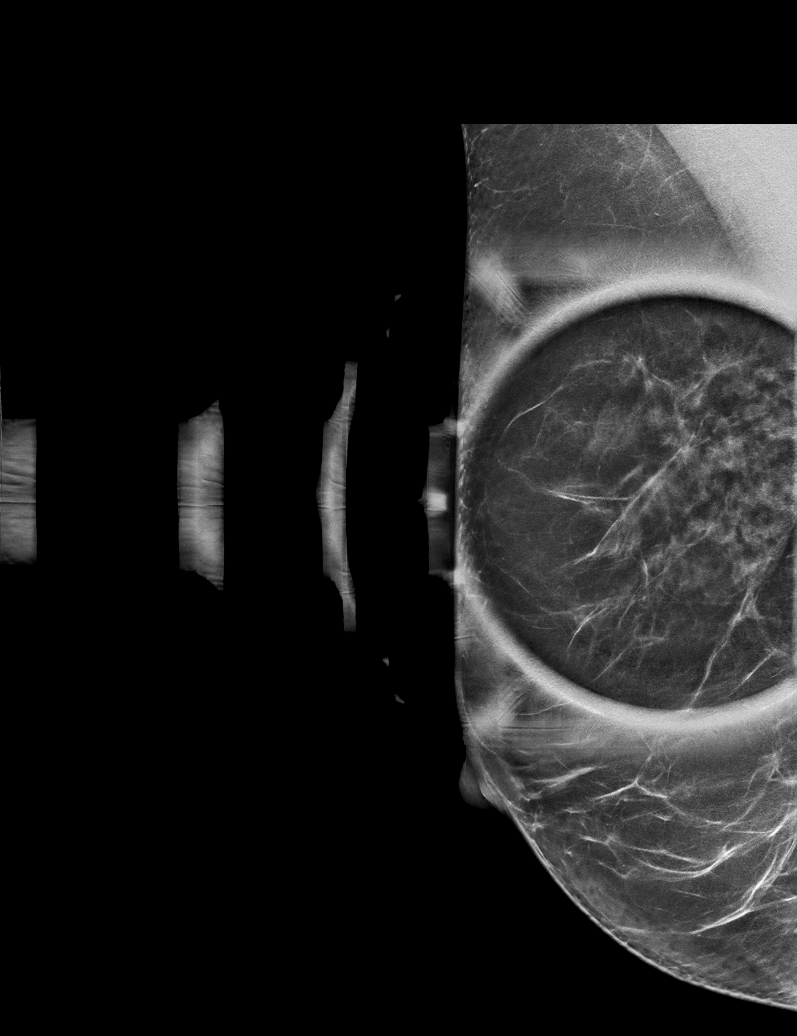

[R ML synth-2D (2 of 2)]
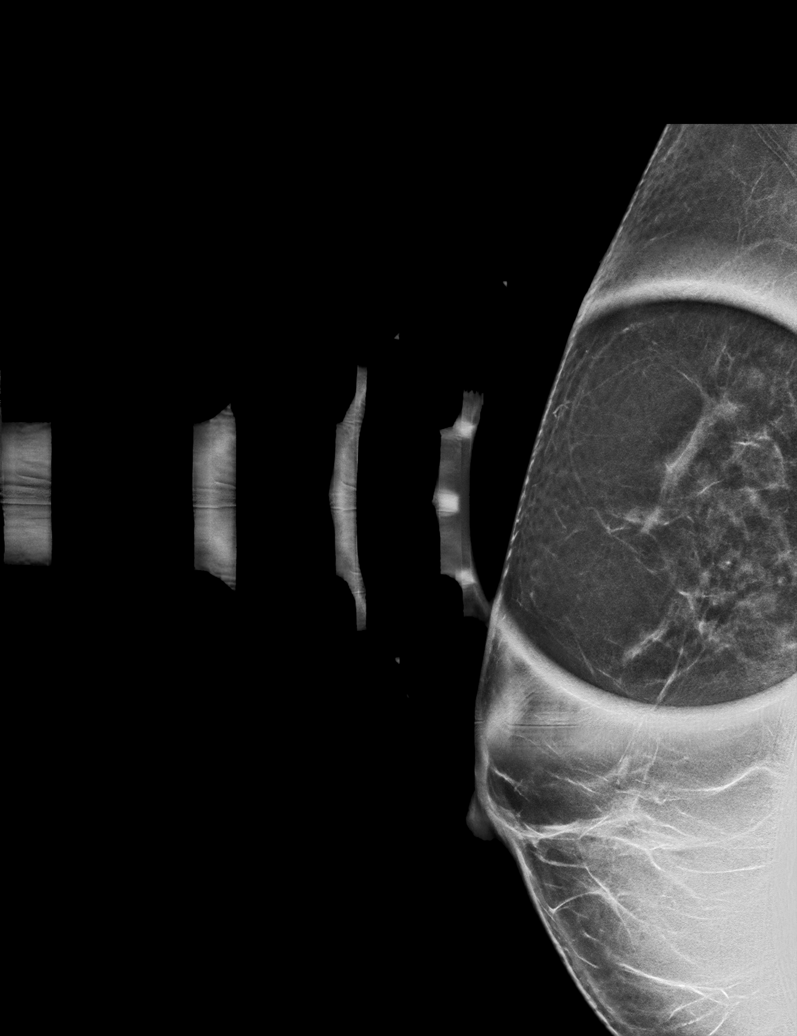

[L ML synth-2D]
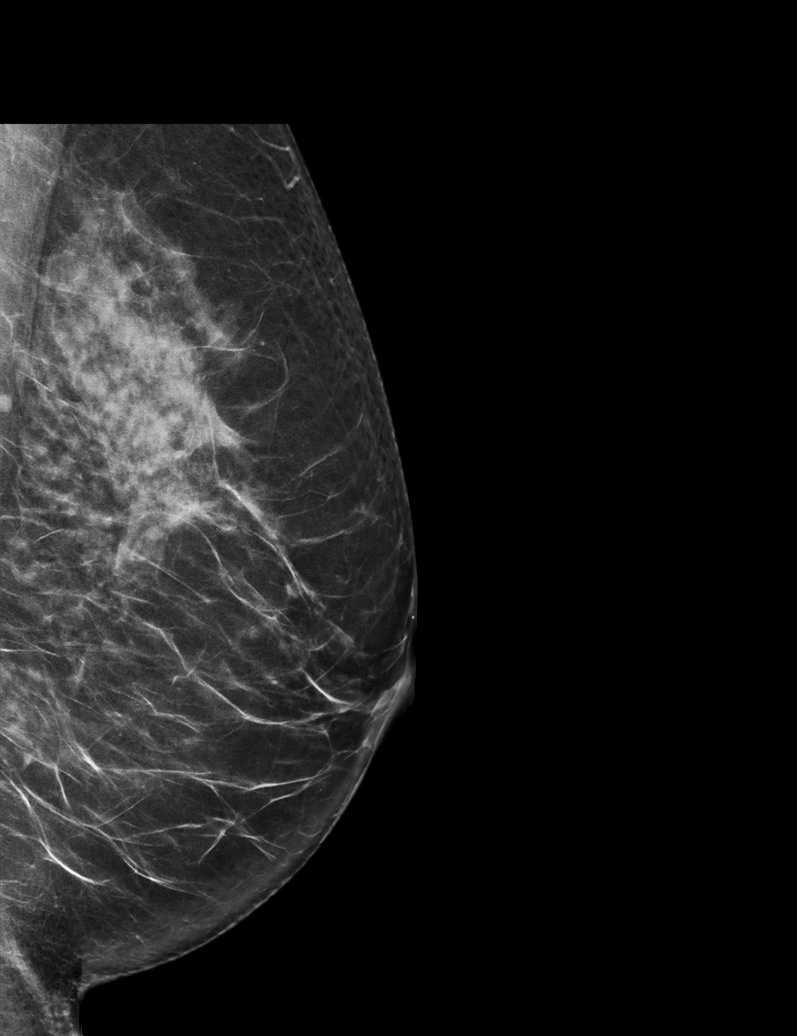

[L ML tomo · tomo slice 38/75.0]
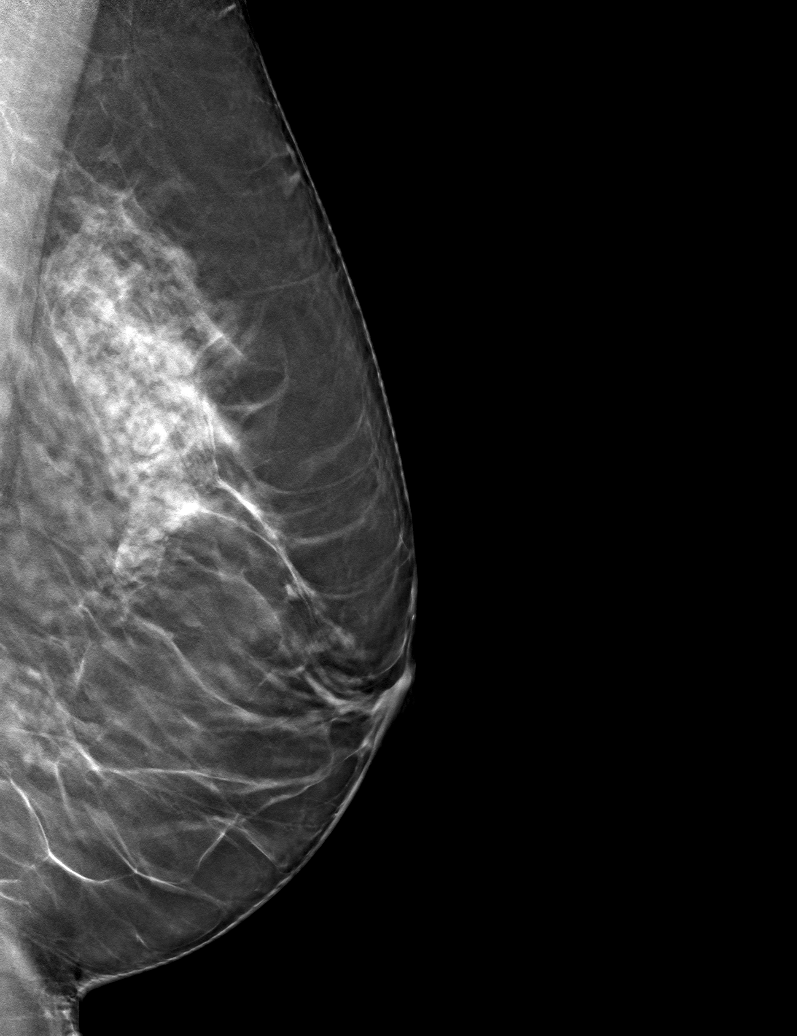

[6 of 30 positions shown; findings below may reference images not displayed]

ACR Breast Density Category c: The breast tissue is heterogeneously
dense, which may obscure small masses.
FINDINGS: On today's additional views of the RIGHT breast, including spot
compression with 3D tomosynthesis, there is no persistent
abnormality indicating superimposition of normal dense
fibroglandular tissues. There are no new dominant masses, suspicious
calcifications or secondary signs of malignancy identified within
the RIGHT breast on today's exam.

On today's additional views of the LEFT breast, including spot
compression with 3D tomosynthesis, there is no persistent asymmetry
indicating superimposition of normal fibroglandular tissues and
Velciug ligaments. There are no new dominant masses, suspicious
calcifications or secondary signs of malignancy identified within
the LEFT breast on today's exam.

Mammographic images were processed with CAD.
IMPRESSION: No evidence of malignancy within either breast. Patient may return
to routine annual bilateral screening mammogram schedule.

RECOMMENDATION:
Screening mammogram in one year.(Code:D4-H-D2B)

I have discussed the findings and recommendations with the patient.
If applicable, a reminder letter will be sent to the patient
regarding the next appointment.

BI-RADS CATEGORY  1: Negative.

## 2020-02-14 ENCOUNTER — Telehealth: Payer: BC Managed Care – PPO | Admitting: Emergency Medicine

## 2020-02-14 DIAGNOSIS — R059 Cough, unspecified: Secondary | ICD-10-CM | POA: Diagnosis not present

## 2020-02-14 MED ORDER — BENZONATATE 100 MG PO CAPS: 100.0000 mg | ORAL_CAPSULE | Freq: Two times a day (BID) | ORAL | 0 refills | Status: DC | PRN

## 2020-02-14 MED ORDER — AZITHROMYCIN 250 MG PO TABS: ORAL_TABLET | ORAL | 0 refills | Status: DC

## 2020-02-14 NOTE — Progress Notes (Signed)
We are sorry that you are not feeling well.  Here is how we plan to help!  Based on your presentation I believe you most likely have A cough due to bacteria.  When patients have a fever and a productive cough with a change in color or increased sputum production, we are concerned about bacterial bronchitis.  If left untreated it can progress to pneumonia.  If your symptoms do not improve with your treatment plan it is important that you contact your provider.   I have prescribed Azithromyin 250 mg: two tablets now and then one tablet daily for 4 additonal days    In addition  you may use A prescription cough medication called Tessalon Perles 100mg. You may take 1-2 capsules every 8 hours as needed for your cough.    From your responses in the eVisit questionnaire you describe inflammation in the upper respiratory tract which is causing a significant cough.  This is commonly called Bronchitis and has four common causes:    Allergies  Viral Infections  Acid Reflux  Bacterial Infection Allergies, viruses and acid reflux are treated by controlling symptoms or eliminating the cause. An example might be a cough caused by taking certain blood pressure medications. You stop the cough by changing the medication. Another example might be a cough caused by acid reflux. Controlling the reflux helps control the cough.  USE OF BRONCHODILATOR ("RESCUE") INHALERS: There is a risk from using your bronchodilator too frequently.  The risk is that over-reliance on a medication which only relaxes the muscles surrounding the breathing tubes can reduce the effectiveness of medications prescribed to reduce swelling and congestion of the tubes themselves.  Although you feel brief relief from the bronchodilator inhaler, your asthma may actually be worsening with the tubes becoming more swollen and filled with mucus.  This can delay other crucial treatments, such as oral steroid medications. If you need to use a  bronchodilator inhaler daily, several times per day, you should discuss this with your provider.  There are probably better treatments that could be used to keep your asthma under control.     HOME CARE . Only take medications as instructed by your medical team. . Complete the entire course of an antibiotic. . Drink plenty of fluids and get plenty of rest. . Avoid close contacts especially the very young and the elderly . Cover your mouth if you cough or cough into your sleeve. . Always remember to wash your hands . A steam or ultrasonic humidifier can help congestion.   GET HELP RIGHT AWAY IF: . You develop worsening fever. . You become short of breath . You cough up blood. . Your symptoms persist after you have completed your treatment plan MAKE SURE YOU   Understand these instructions.  Will watch your condition.  Will get help right away if you are not doing well or get worse.  Your e-visit answers were reviewed by a board certified advanced clinical practitioner to complete your personal care plan.  Depending on the condition, your plan could have included both over the counter or prescription medications. If there is a problem please reply  once you have received a response from your provider. Your safety is important to us.  If you have drug allergies check your prescription carefully.    You can use MyChart to ask questions about today's visit, request a non-urgent call back, or ask for a work or school excuse for 24 hours related to this e-Visit. If it   has been greater than 24 hours you will need to follow up with your provider, or enter a new e-Visit to address those concerns. You will get an e-mail in the next two days asking about your experience.  I hope that your e-visit has been valuable and will speed your recovery. Thank you for using e-visits.  Approximately 5 minutes was used in reviewing the patient's chart, questionnaire, prescribing medications, and  documentation.  

## 2021-01-03 ENCOUNTER — Ambulatory Visit (INDEPENDENT_AMBULATORY_CARE_PROVIDER_SITE_OTHER): Payer: BC Managed Care – PPO | Admitting: Family Medicine

## 2021-01-03 ENCOUNTER — Other Ambulatory Visit: Payer: Self-pay

## 2021-01-03 ENCOUNTER — Encounter: Payer: Self-pay | Admitting: Family Medicine

## 2021-01-03 VITALS — BP 137/80 | HR 81 | Temp 97.7°F | Resp 16 | Ht 63.0 in | Wt 166.0 lb

## 2021-01-03 DIAGNOSIS — Z7689 Persons encountering health services in other specified circumstances: Secondary | ICD-10-CM | POA: Diagnosis not present

## 2021-01-03 DIAGNOSIS — L2089 Other atopic dermatitis: Secondary | ICD-10-CM | POA: Diagnosis not present

## 2021-01-03 DIAGNOSIS — Z0000 Encounter for general adult medical examination without abnormal findings: Secondary | ICD-10-CM | POA: Diagnosis not present

## 2021-01-03 DIAGNOSIS — Z1231 Encounter for screening mammogram for malignant neoplasm of breast: Secondary | ICD-10-CM

## 2021-01-03 MED ORDER — PHENTERMINE HCL 15 MG PO CAPS: 15.0000 mg | ORAL_CAPSULE | ORAL | 0 refills | Status: DC

## 2021-01-03 MED ORDER — HALOBETASOL PROPIONATE 0.05 % EX OINT: TOPICAL_OINTMENT | Freq: Every day | CUTANEOUS | 1 refills | Status: DC | PRN

## 2021-01-03 NOTE — Progress Notes (Signed)
Subjective:     Anna Ray is a 55 y.o. female and is here for a comprehensive physical exam. The patient reports no problems.  She is doing well overall.  She has been having a little more eczema breakout on her nipples and so would like a refill on her topical steroid cream.  She plans on scheduling her mammogram after the new year.  Also be interested in restarting phentermine she had taken it a couple years ago and did well overall but it did affect her sleep a little bit.  But she says she is very sensitive to medications.  This year she actually started taking melatonin for her sleep and feels like it is actually been helpful.  Social History   Socioeconomic History   Marital status: Married    Spouse name: tim   Number of children: 1   Years of education: Not on file   Highest education level: Not on file  Occupational History   Not on file  Tobacco Use   Smoking status: Former    Types: Cigarettes    Quit date: 11/21/1992    Years since quitting: 28.1   Smokeless tobacco: Never  Vaping Use   Vaping Use: Never used  Substance and Sexual Activity   Alcohol use: No   Drug use: No   Sexual activity: Yes    Partners: Male  Other Topics Concern   Not on file  Social History Narrative   2 cups of caffeine. Not exercising regularly    Social Determinants of Health   Financial Resource Strain: Not on file  Food Insecurity: Not on file  Transportation Needs: Not on file  Physical Activity: Not on file  Stress: Not on file  Social Connections: Not on file  Intimate Partner Violence: Not on file   Health Maintenance  Topic Date Due   HIV Screening  Never done   Hepatitis C Screening  Never done   MAMMOGRAM  11/12/2020   COVID-19 Vaccine (1) 01/19/2021 (Originally 08/11/1965)   Zoster Vaccines- Shingrix (1 of 2) 03/21/2021 (Originally 02/10/2017)   INFLUENZA VACCINE  04/20/2021 (Originally 08/21/2020)   PAP SMEAR-Modifier  10/02/2021   TETANUS/TDAP  10/24/2022    COLONOSCOPY (Pts 45-44yrs Insurance coverage will need to be confirmed)  03/19/2028   Pneumococcal Vaccine 19-31 Years old  Aged Out   HPV VACCINES  Aged Out    The following portions of the patient's history were reviewed and updated as appropriate: allergies, current medications, past family history, past medical history, past social history, past surgical history, and problem list.  Review of Systems Pertinent items are noted in HPI.   Objective:    BP 137/80    Pulse 81    Temp 97.7 F (36.5 C)    Resp 16    Ht 5\' 3"  (1.6 m)    Wt 166 lb (75.3 kg)    LMP 02/20/2012    SpO2 100%    BMI 29.41 kg/m  General appearance: alert, cooperative, and appears stated age Head: Normocephalic, without obvious abnormality, atraumatic Eyes:  conj clear, EOMI, PEERLA Ears: normal TM's and external ear canals both ears Nose: Nares normal. Septum midline. Mucosa normal. No drainage or sinus tenderness. Throat: lips, mucosa, and tongue normal; teeth and gums normal Neck: no adenopathy, no carotid bruit, no JVD, supple, symmetrical, trachea midline, and thyroid not enlarged, symmetric, no tenderness/mass/nodules Back: symmetric, no curvature. ROM normal. No CVA tenderness. Lungs: clear to auscultation bilaterally Breasts: normal appearance, no masses  or tenderness Heart: regular rate and rhythm, S1, S2 normal, no murmur, click, rub or gallop Abdomen: soft, non-tender; bowel sounds normal; no masses,  no organomegaly Extremities: extremities normal, atraumatic, no cyanosis or edema Pulses: 2+ and symmetric Skin: Skin color, texture, turgor normal. No rashes or lesions Lymph nodes: Cervical, supraclavicular, and axillary nodes normal. Neurologic: Grossly normal    Assessment:    Healthy female exam.      Plan:     See After Visit Summary for Counseling Recommendations  Keep up a regular exercise program and make sure you are eating a healthy diet Try to eat 4 servings of dairy a day, or if you  are lactose intolerant take a calcium with vitamin D daily.  Your vaccines are up to date.    Weight management -she would like to retry the phentermine and continue to take her melatonin at night which has been helping her sleep.  Visit #: 1 Starting Weight: 166 lbs   Current weight:  Previous weight: Change in weight: Goal weight:  Dietary goals: Small dietary goals as well. Exercise goals: Small goals for herself over the next few weeks to increase activity level. Medication: Phentermine 50 mg daily. Follow-up and referrals: 1 month.

## 2021-01-03 NOTE — Progress Notes (Signed)
Patient stated she will schedule her Mammogram in 02/2021.

## 2021-01-04 LAB — COMPLETE METABOLIC PANEL WITH GFR
AG Ratio: 1.7 (calc) (ref 1.0–2.5)
ALT: 20 U/L (ref 6–29)
AST: 22 U/L (ref 10–35)
Albumin: 4.5 g/dL (ref 3.6–5.1)
Alkaline phosphatase (APISO): 105 U/L (ref 37–153)
BUN: 12 mg/dL (ref 7–25)
CO2: 32 mmol/L (ref 20–32)
Calcium: 9.7 mg/dL (ref 8.6–10.4)
Chloride: 101 mmol/L (ref 98–110)
Creat: 0.68 mg/dL (ref 0.50–1.03)
Globulin: 2.6 g/dL (calc) (ref 1.9–3.7)
Glucose, Bld: 81 mg/dL (ref 65–99)
Potassium: 4.8 mmol/L (ref 3.5–5.3)
Sodium: 140 mmol/L (ref 135–146)
Total Bilirubin: 0.6 mg/dL (ref 0.2–1.2)
Total Protein: 7.1 g/dL (ref 6.1–8.1)
eGFR: 104 mL/min/{1.73_m2} (ref >=60)

## 2021-01-04 LAB — LIPID PANEL
Cholesterol: 183 mg/dL (ref <200)
HDL: 67 mg/dL (ref >=50)
LDL Cholesterol (Calc): 99 mg/dL (calc)
Non-HDL Cholesterol (Calc): 116 mg/dL (calc) (ref <130)
Total CHOL/HDL Ratio: 2.7 (calc) (ref <5.0)
Triglycerides: 78 mg/dL (ref <150)

## 2021-01-04 LAB — CBC
HCT: 45.7 % — ABNORMAL HIGH (ref 35.0–45.0)
Hemoglobin: 14.9 g/dL (ref 11.7–15.5)
MCH: 30.2 pg (ref 27.0–33.0)
MCHC: 32.6 g/dL (ref 32.0–36.0)
MCV: 92.7 fL (ref 80.0–100.0)
MPV: 11.1 fL (ref 7.5–12.5)
Platelets: 315 10*3/uL (ref 140–400)
RBC: 4.93 10*6/uL (ref 3.80–5.10)
RDW: 12.2 % (ref 11.0–15.0)
WBC: 5.4 10*3/uL (ref 3.8–10.8)

## 2021-01-04 LAB — TSH: TSH: 2.06 mIU/L

## 2021-01-05 NOTE — Progress Notes (Signed)
Hi Anna Ray, thyroid looks good at 2.0.  Your blood count is normal.  Cholesterol looks great this year.  Your metabolic panel also is normal.  Normal liver and kidney function.

## 2021-02-02 ENCOUNTER — Ambulatory Visit: Payer: BC Managed Care – PPO

## 2021-02-20 ENCOUNTER — Encounter: Payer: Self-pay | Admitting: Family Medicine

## 2021-02-21 ENCOUNTER — Encounter: Payer: Self-pay | Admitting: Family Medicine

## 2021-02-21 NOTE — Telephone Encounter (Signed)
Okay for letter saying that she is a great candidate for their program.  Please fax as below.

## 2021-02-28 ENCOUNTER — Encounter: Payer: Self-pay | Admitting: Family Medicine

## 2021-02-28 MED ORDER — SCOPOLAMINE 1 MG/3DAYS TD PT72: 1.0000 | MEDICATED_PATCH | TRANSDERMAL | 0 refills | Status: DC

## 2021-02-28 NOTE — Telephone Encounter (Signed)
Meds ordered this encounter  Medications   scopolamine (TRANSDERM-SCOP) 1 MG/3DAYS    Sig: Place 1 patch (1.5 mg total) onto the skin every 3 (three) days.    Dispense:  5 patch    Refill:  0

## 2021-03-14 DIAGNOSIS — H10023 Other mucopurulent conjunctivitis, bilateral: Secondary | ICD-10-CM | POA: Diagnosis not present

## 2022-01-03 ENCOUNTER — Ambulatory Visit (INDEPENDENT_AMBULATORY_CARE_PROVIDER_SITE_OTHER): Payer: BC Managed Care – PPO | Admitting: Family Medicine

## 2022-01-03 ENCOUNTER — Encounter: Payer: Self-pay | Admitting: Family Medicine

## 2022-01-03 VITALS — BP 133/77 | HR 63 | Ht 63.0 in | Wt 175.0 lb

## 2022-01-03 DIAGNOSIS — Z114 Encounter for screening for human immunodeficiency virus [HIV]: Secondary | ICD-10-CM | POA: Diagnosis not present

## 2022-01-03 DIAGNOSIS — Z1159 Encounter for screening for other viral diseases: Secondary | ICD-10-CM

## 2022-01-03 DIAGNOSIS — Z0000 Encounter for general adult medical examination without abnormal findings: Secondary | ICD-10-CM | POA: Diagnosis not present

## 2022-01-03 DIAGNOSIS — Z1231 Encounter for screening mammogram for malignant neoplasm of breast: Secondary | ICD-10-CM | POA: Diagnosis not present

## 2022-01-03 DIAGNOSIS — L2089 Other atopic dermatitis: Secondary | ICD-10-CM

## 2022-01-03 MED ORDER — HALOBETASOL PROPIONATE 0.05 % EX OINT: TOPICAL_OINTMENT | Freq: Every day | CUTANEOUS | 1 refills | Status: DC | PRN

## 2022-01-03 NOTE — Addendum Note (Signed)
Addended by: Nani Gasser D on: 01/03/2022 12:12 PM   Modules accepted: Orders

## 2022-01-03 NOTE — Progress Notes (Addendum)
Complete physical exam  Patient: Anna Ray   DOB: 1965/04/17   56 y.o. Female  MRN: [id number]  Subjective:    Chief Complaint  Patient presents with   Annual Exam    Anna Ray is a 56 y.o. female who presents today for a complete physical exam. She reports consuming a general diet. The patient does not participate in regular exercise at present. She generally feels fairly well. She reports sleeping fairly well. She does not have additional problems to discuss today. Plans to return for labs next week.    Most recent fall risk assessment:    01/03/2022   10:41 AM  Fall Risk   Falls in the past year? 0  Number falls in past yr: 0  Injury with Fall? 0  Risk for fall due to : No Fall Risks  Follow up Falls evaluation completed     Most recent depression screenings:    01/03/2022   10:41 AM 01/03/2021    8:55 AM  PHQ 2/9 Scores  PHQ - 2 Score 0 0        Patient Care Team: Agapito Games, MD as PCP - General   Outpatient Medications Prior to Visit  Medication Sig   Ascorbic Acid (VITAMIN C) 100 MG tablet Take 100 mg by mouth daily.    melatonin 1 MG TABS tablet Take 1 mg by mouth at bedtime.   montelukast (SINGULAIR) 10 MG tablet TAKE 1 TABLET BY MOUTH EVERYDAY AT BEDTIME   Multiple Vitamins-Minerals (CENTRUM SILVER 50+WOMEN) TABS Take 1 tablet by mouth daily.   [DISCONTINUED] halobetasol (ULTRAVATE) 0.05 % ointment Apply topically daily as needed.   [DISCONTINUED] phentermine 15 MG capsule Take 1 capsule (15 mg total) by mouth every morning.   [DISCONTINUED] scopolamine (TRANSDERM-SCOP) 1 MG/3DAYS Place 1 patch (1.5 mg total) onto the skin every 3 (three) days.   No facility-administered medications prior to visit.    ROS        Objective:     BP 133/77 (BP Location: Right Arm, Patient Position: Sitting, Cuff Size: Large)   Pulse 63   Ht 5\' 3"  (1.6 m)   Wt 175 lb (79.4 kg)   LMP 02/20/2012   SpO2 98%   BMI 31.00 kg/m     Physical Exam Vitals reviewed.  Constitutional:      Appearance: She is well-developed.  HENT:     Head: Normocephalic and atraumatic.     Right Ear: Tympanic membrane, ear canal and external ear normal.     Left Ear: Tympanic membrane, ear canal and external ear normal.     Nose: Nose normal.     Mouth/Throat:     Pharynx: Oropharynx is clear.  Eyes:     Conjunctiva/sclera: Conjunctivae normal.     Pupils: Pupils are equal, round, and reactive to light.  Neck:     Thyroid: No thyromegaly.  Cardiovascular:     Rate and Rhythm: Normal rate and regular rhythm.     Heart sounds: Normal heart sounds.  Pulmonary:     Effort: Pulmonary effort is normal.     Breath sounds: Normal breath sounds. No wheezing.  Abdominal:     General: Bowel sounds are normal.     Palpations: Abdomen is soft.  Musculoskeletal:     Cervical back: Neck supple.  Lymphadenopathy:     Cervical: No cervical adenopathy.  Skin:    General: Skin is warm and dry.     Coloration: Skin is not  pale.  Neurological:     Mental Status: She is alert and oriented to person, place, and time.  Psychiatric:        Behavior: Behavior normal.      No results found for any visits on 01/03/22.     Assessment & Plan:    Routine Health Maintenance and Physical Exam  Immunization History  Administered Date(s) Administered   Influenza Split 11/09/2013   Influenza,inj,Quad PF,6+ Mos 10/30/2015, 11/05/2018   Td 03/22/2002   Tdap 10/23/2012    Health Maintenance  Topic Date Due   MAMMOGRAM  11/12/2020   PAP SMEAR-Modifier  01/03/2022 (Originally 10/02/2021)   COVID-19 Vaccine (1) 01/19/2022 (Originally 08/11/1965)   Zoster Vaccines- Shingrix (1 of 2) 04/04/2022 (Originally 02/10/2017)   INFLUENZA VACCINE  04/21/2022 (Originally 08/21/2021)   Hepatitis C Screening  01/04/2023 (Originally [birth year]10/1985)   DTaP/Tdap/Td (3 - Td or Tdap) 10/24/2022   COLONOSCOPY (Pts 45-44yrs Insurance coverage will need to be confirmed)   03/19/2028   HIV Screening  Completed   Pneumococcal Vaccine 79-71 Years old  Aged Out   HPV VACCINES  Aged Out    Discussed health benefits of physical activity, and encouraged her to engage in regular exercise appropriate for her age and condition.  Problem List Items Addressed This Visit       Musculoskeletal and Integument   Dermatitis, atopic   Relevant Medications   halobetasol (ULTRAVATE) 0.05 % ointment   Other Visit Diagnoses     Wellness examination    -  Primary   Relevant Orders   COMPLETE METABOLIC PANEL WITH GFR   Lipid panel   CBC   HIV Antibody (routine testing w rflx)   Hepatitis C Antibody   Screening mammogram, encounter for       Relevant Orders   MM 3D SCREEN BREAST BILATERAL   Screening for HIV without presence of risk factors       Relevant Orders   HIV Antibody (routine testing w rflx)   Encounter for hepatitis C screening test for low risk patient       Relevant Orders   Hepatitis C Antibody       Keep up a regular exercise program and make sure you are eating a healthy diet Try to eat 4 servings of dairy a day, or if you are lactose intolerant take a calcium with vitamin D daily.  Your vaccines are up to date.  Plans to start exerccising again.   Will schedule with Dr.  Jearld Lesch for pap in Feb. She want to get her mammo done in Feb. Order placed today.    Return in about 1 year (around 01/04/2023) for Wellness Exam.     Nani Gasser, MD

## 2022-01-08 DIAGNOSIS — Z713 Dietary counseling and surveillance: Secondary | ICD-10-CM | POA: Diagnosis not present

## 2022-01-28 DIAGNOSIS — L209 Atopic dermatitis, unspecified: Secondary | ICD-10-CM | POA: Diagnosis not present

## 2022-01-28 DIAGNOSIS — L299 Pruritus, unspecified: Secondary | ICD-10-CM | POA: Diagnosis not present

## 2022-01-28 DIAGNOSIS — L281 Prurigo nodularis: Secondary | ICD-10-CM | POA: Diagnosis not present

## 2022-03-11 DIAGNOSIS — L299 Pruritus, unspecified: Secondary | ICD-10-CM | POA: Diagnosis not present

## 2022-03-11 DIAGNOSIS — L281 Prurigo nodularis: Secondary | ICD-10-CM | POA: Diagnosis not present

## 2022-03-11 DIAGNOSIS — L209 Atopic dermatitis, unspecified: Secondary | ICD-10-CM | POA: Diagnosis not present

## 2022-03-11 DIAGNOSIS — L821 Other seborrheic keratosis: Secondary | ICD-10-CM | POA: Diagnosis not present

## 2022-08-14 ENCOUNTER — Ambulatory Visit (INDEPENDENT_AMBULATORY_CARE_PROVIDER_SITE_OTHER): Payer: BC Managed Care – PPO

## 2022-08-14 DIAGNOSIS — Z1231 Encounter for screening mammogram for malignant neoplasm of breast: Secondary | ICD-10-CM

## 2022-08-15 NOTE — Progress Notes (Signed)
Please call patient. Normal mammogram.  Repeat in 1 year.  

## 2022-09-26 ENCOUNTER — Telehealth: Payer: BC Managed Care – PPO | Admitting: Physician Assistant

## 2022-09-26 DIAGNOSIS — M25511 Pain in right shoulder: Secondary | ICD-10-CM

## 2022-09-26 MED ORDER — METHYLPREDNISOLONE 4 MG PO TBPK: ORAL_TABLET | ORAL | 0 refills | Status: DC

## 2022-09-26 NOTE — Progress Notes (Signed)
Virtual Visit Consent   Anna Ray, you are scheduled for a virtual visit with a [facility] provider today. Just as with appointments in the office, your consent must be obtained to participate. Your consent will be active for this visit and any virtual visit you may have with one of our providers in the next 365 days. If you have a MyChart account, a copy of this consent can be sent to you electronically.  As this is a virtual visit, video technology does not allow for your provider to perform a traditional examination. This may limit your provider's ability to fully assess your condition. If your provider identifies any concerns that need to be evaluated in person or the need to arrange testing (such as labs, EKG, etc.), we will make arrangements to do so. Although advances in technology are sophisticated, we cannot ensure that it will always work on either your end or our end. If the connection with a video visit is poor, the visit may have to be switched to a telephone visit. With either a video or telephone visit, we are not always able to ensure that we have a secure connection.  By engaging in this virtual visit, you consent to the provision of healthcare and authorize for your insurance to be billed (if applicable) for the services provided during this visit. Depending on your insurance coverage, you may receive a charge related to this service.  I need to obtain your verbal consent now. Are you willing to proceed with your visit today? Verity A. Gladstone has provided verbal consent on 09/26/2022 for a virtual visit (video or telephone). [name], PA-C  Date: 09/26/2022 9:34 AM  Virtual Visit via Video Note   I, [name], connected with  Anna Ray  ([id number], 1965-05-05) on 09/26/22 at  8:45 AM EDT by a video-enabled telemedicine application and verified that I am speaking with the correct person using two identifiers.  Location: Patient: Virtual Visit  Location Patient: Home Provider: Virtual Visit Location Provider: Home Office   I discussed the limitations of evaluation and management by telemedicine and the availability of in person appointments. The patient expressed understanding and agreed to proceed.    History of Present Illness: Anna Ray is a 57 y.o. who identifies as a female who was assigned female at birth, and is being seen today for right shoulder pain.  HPI: Shoulder Pain  The pain is present in the right shoulder. This is a new problem. The current episode started in the past 7 days (2-3 days ago). There has been no history of extremity trauma. The problem occurs constantly. The problem has been gradually worsening. The quality of the pain is described as aching and sharp. The pain is moderate. Associated symptoms include an inability to bear weight (unable to lift), a limited range of motion and stiffness. Pertinent negatives include no fever, itching, joint locking, joint swelling, numbness or tingling. The symptoms are aggravated by activity (did awaken from sleep). She has tried heat, OTC ointments, rest and NSAIDS (magnesium, epsom salt soaks) for the symptoms. The treatment provided no relief. Family history does not include rheumatoid arthritis. There is no history of diabetes, gout, osteoarthritis or rheumatoid arthritis.     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   Menopausal symptoms 05/19/2012  Fatigue 05/19/2012   Dermatitis, atopic 05/23/2006   Seasonal allergic rhinitis due to pollen 12/03/2005    Allergies:  Allergies  Allergen Reactions   Milk-Related Compounds    Montelukast Sodium     REACTION: Severe Headache   Egg-Derived Products Other (See Comments)   Gluten Meal Other (See Comments)    Headaches, disrupts sleep   Lanolin Rash    Medications:  Current Outpatient Medications:    methylPREDNISolone (MEDROL DOSEPAK) 4 MG TBPK tablet, 6 day taper; take as directed on package instructions, Disp: 21 tablet, Rfl: 0   Ascorbic Acid (VITAMIN C) 100 MG tablet, Take 100 mg by mouth daily. , Disp: , Rfl:    halobetasol (ULTRAVATE) 0.05 % ointment, Apply topically daily as needed., Disp: 50 g, Rfl: 1   melatonin 1 MG TABS tablet, Take 1 mg by mouth at bedtime., Disp: , Rfl:    montelukast (SINGULAIR) 10 MG tablet, TAKE 1 TABLET BY MOUTH EVERYDAY AT BEDTIME, Disp: 90 tablet, Rfl: 3   Multiple Vitamins-Minerals (CENTRUM SILVER 50+WOMEN) TABS, Take 1 tablet by mouth daily., Disp: , Rfl:   Observations/Objective: Patient is well-developed, well-nourished in no acute distress.  Resting comfortably at home.  Head is normocephalic, atraumatic.  No labored breathing.  Speech is clear and coherent with logical content.  Patient is alert and oriented at baseline.  Right shoulder is limited in forward flexion, abduction, external rotation at 0 degree (unable to get arm to 90 degree)  Assessment and Plan: 1. Acute pain of right shoulder - methylPREDNISolone (MEDROL DOSEPAK) 4 MG TBPK tablet; 6 day taper; take as directed on package instructions  Dispense: 21 tablet; Refill: 0  - Suspect bursitis vs rotator cuff tendinitis/tendinopathy - Medrol prescribed - Can continue topical magnesium - Epsom salt soaks or heat treatments - Tylenol as needed - Avoid NSAIDs (ibuprofen/Advil/Motrin or naproxen/Aleve - Start simple ROM exercises (sent in AVS) - Follow up with orthopedics if not improving or if symptoms worsen  Follow Up Instructions: I discussed the assessment and treatment plan with the patient. The patient was provided an opportunity to ask questions and all were answered. The patient agreed with the plan and demonstrated an understanding of the instructions.  A copy of instructions were sent to the patient via MyChart unless  otherwise noted below.    The patient was advised to call back or seek an in-person evaluation if the symptoms worsen or if the condition fails to improve as anticipated.  Time:  I spent 15 minutes with the patient via telehealth technology discussing the above problems/concerns.    [name], PA-C

## 2022-09-26 NOTE — Patient Instructions (Signed)
Anna Ray, thank you for joining [name], PA-C for today's virtual visit.  While this provider is not your primary care provider (PCP), if your PCP is located in our provider database this encounter information will be shared with them immediately following your visit.   A [facility] MyChart account gives you access to today's visit and all your visits, tests, and labs performed at [facility] " click here if you don't have a [place] MyChart account or go to [email or web address]  Consent: (Patient) Anna Ray provided verbal consent for this virtual visit at the beginning of the encounter.  Current Medications:  Current Outpatient Medications:    methylPREDNISolone (MEDROL DOSEPAK) 4 MG TBPK tablet, 6 day taper; take as directed on package instructions, Disp: 21 tablet, Rfl: 0   Ascorbic Acid (VITAMIN C) 100 MG tablet, Take 100 mg by mouth daily. , Disp: , Rfl:    halobetasol (ULTRAVATE) 0.05 % ointment, Apply topically daily as needed., Disp: 50 g, Rfl: 1   melatonin 1 MG TABS tablet, Take 1 mg by mouth at bedtime., Disp: , Rfl:    montelukast (SINGULAIR) 10 MG tablet, TAKE 1 TABLET BY MOUTH EVERYDAY AT BEDTIME, Disp: 90 tablet, Rfl: 3   Multiple Vitamins-Minerals (CENTRUM SILVER 50+WOMEN) TABS, Take 1 tablet by mouth daily., Disp: , Rfl:    Medications ordered in this encounter:  Meds ordered this encounter  Medications   methylPREDNISolone (MEDROL DOSEPAK) 4 MG TBPK tablet    Sig: 6 day taper; take as directed on package instructions    Dispense:  21 tablet    Refill:  0    Order Specific Question:   Supervising Provider    Answer:   Merrilee Jansky [[id number]]     *If you need refills on other medications prior to your next appointment, please contact your pharmacy*  Follow-Up: Call back or seek an in-person evaluation if the symptoms worsen or if the condition fails to improve as anticipated.  [facility] [phone]  Other Instructions  [facility] Location: [facility] [address], [address] [place], [place] [address] Phone: [phone] Convenient hours: Monday - Friday: 11 a.m. - 7 p.m. Visit our website to schedule an appointment online or walk-in during clinic hours. Your well-being is our priority. Move better with [place]!   [facility] [address], [place], [place] [address] URGENT CARE HOURS Monday - Friday: 8:00am to 8:00pm Saturday: 10:00am to 3:00pm [phone]   Shoulder Range of Motion Exercises Shoulder range of motion (ROM) exercises are done to keep the shoulder moving freely or to increase movement. They are recommended for people who have shoulder pain or stiffness or who are recovering from a shoulder surgery. Ask your health care provider which exercises are safe for you. Do exercises exactly as told by your health care provider and adjust them as directed. It is normal to feel mild stretching, pulling, tightness, or discomfort as you do these exercises. Stop right away if you feel sudden pain or your pain gets worse. Do not begin these exercises until told by your health care provider. Phase 1 exercise When you are able, do this exercise 1-2 times a day for 30-60 seconds in each direction, or as directed by your health care provider. Pendulum exercise     To do this exercise while sitting: Sit in a chair or at the edge of your bed with your feet flat on the  floor. Let your affected arm hang down in front of you over the edge of the bed or chair. Relax your shoulder, arm, and hand. Rock your body so your arm gently swings in small circles. You can also use your unaffected arm to start the motion. Repeat, changing the direction of the circles, swinging your arm left and right, and swinging your arm forward and back. To do this exercise while standing: Stand next to a sturdy chair or  table, and hold on to it with your hand on your unaffected side. Bend forward at the waist. Bend your knees slightly. Relax your shoulder, arm, and hand. While keeping your shoulder relaxed, use body motion to swing your arm in small circles. Repeat, changing the direction of the circles, swinging your arm left and right, and swinging your arm forward and back. Between exercises, stand up tall and take a short break to relax your lower back.  Phase 2 exercises Do these exercises 1-2 times a day or as told by your health care provider. Hold each stretch for 30 seconds, and repeat 3 times. Do the exercises with one or both arms as instructed by your health care provider. For these exercises, sit at a table with your hand and arm supported by the table. A chair that slides easily or has wheels can be helpful. External rotation   If you have been instructed to have an in-person evaluation today at a local Urgent Care facility, please use the link below. It will take you to a list of all of our available [facility], including address, phone number and hours of operation. Please do not delay care.  [facility]  If you or a family member do not have a primary care provider, use the link below to schedule a visit and establish care. When you choose a [facility] primary care physician or advanced practice provider, you gain a long-term partner in health. Find a Primary Care Provider  Learn more about [place]'s in-office and virtual care options: [place] - Get Care Now

## 2022-10-14 ENCOUNTER — Telehealth: Payer: BC Managed Care – PPO | Admitting: Family Medicine

## 2022-10-14 ENCOUNTER — Encounter: Payer: Self-pay | Admitting: Family Medicine

## 2022-10-14 DIAGNOSIS — T753XXA Motion sickness, initial encounter: Secondary | ICD-10-CM

## 2022-10-14 DIAGNOSIS — R3989 Other symptoms and signs involving the genitourinary system: Secondary | ICD-10-CM | POA: Diagnosis not present

## 2022-10-14 MED ORDER — MECLIZINE HCL 25 MG PO TABS: 25.0000 mg | ORAL_TABLET | Freq: Three times a day (TID) | ORAL | 0 refills | Status: DC | PRN

## 2022-10-14 MED ORDER — CEPHALEXIN 500 MG PO CAPS: 500.0000 mg | ORAL_CAPSULE | Freq: Two times a day (BID) | ORAL | 0 refills | Status: AC

## 2022-10-14 NOTE — Progress Notes (Signed)

## 2022-11-07 ENCOUNTER — Other Ambulatory Visit (HOSPITAL_COMMUNITY)
Admission: RE | Admit: 2022-11-07 | Discharge: 2022-11-07 | Disposition: A | Discharge status: Home / Self Care | Payer: BC Managed Care – PPO | Source: Ambulatory Visit | Attending: Family Medicine | Admitting: Family Medicine

## 2022-11-07 ENCOUNTER — Encounter: Payer: Self-pay | Admitting: Family Medicine

## 2022-11-07 ENCOUNTER — Ambulatory Visit (INDEPENDENT_AMBULATORY_CARE_PROVIDER_SITE_OTHER): Payer: BC Managed Care – PPO | Admitting: Family Medicine

## 2022-11-07 VITALS — BP 129/72 | HR 76 | Ht 63.0 in | Wt 169.0 lb

## 2022-11-07 DIAGNOSIS — Z124 Encounter for screening for malignant neoplasm of cervix: Secondary | ICD-10-CM

## 2022-11-07 DIAGNOSIS — Z0000 Encounter for general adult medical examination without abnormal findings: Secondary | ICD-10-CM

## 2022-11-07 NOTE — Progress Notes (Signed)
Complete physical exam  Patient: Anna Ray   DOB: [birth year]   57 y.o. Female  MRN: [id number]  Subjective:    Chief Complaint  Patient presents with   Annual Exam    Anna Ray is a 57 y.o. female who presents today for a complete physical exam. She reports consuming a general diet. The patient does not participate in regular exercise at present. Has been dealing with tendonitis in her shoulder She generally feels well. She reports sleeping well. She does not have additional problems to discuss today.    Most recent fall risk assessment:    11/07/2022    8:52 AM  Fall Risk   Falls in the past year? 0  Number falls in past yr: 0  Injury with Fall? 0  Risk for fall due to : No Fall Risks  Follow up Falls evaluation completed     Most recent depression screenings:    11/07/2022    8:52 AM 01/03/2022   10:41 AM  PHQ 2/9 Scores  PHQ - 2 Score 0 0         Patient Care Team: Agapito Games, MD as PCP - General   Outpatient Medications Prior to Visit  Medication Sig   Ascorbic Acid (VITAMIN C) 100 MG tablet Take 100 mg by mouth daily.    halobetasol (ULTRAVATE) 0.05 % ointment Apply topically daily as needed.   MAGNESIUM GLYCINATE PO Take 1 tablet by mouth at bedtime.   meclizine (DRAMAMINE) 25 MG tablet Take 1 tablet (25 mg total) by mouth 3 (three) times daily as needed for dizziness. Or nausea   [DISCONTINUED] melatonin 1 MG TABS tablet Take 1 mg by mouth at bedtime.   [DISCONTINUED] methylPREDNISolone (MEDROL DOSEPAK) 4 MG TBPK tablet 6 day taper; take as directed on package instructions   [DISCONTINUED] montelukast (SINGULAIR) 10 MG tablet TAKE 1 TABLET BY MOUTH EVERYDAY AT BEDTIME   [DISCONTINUED] Multiple Vitamins-Minerals (CENTRUM SILVER 50+WOMEN) TABS Take 1 tablet by mouth daily.   No facility-administered medications prior to visit.    ROS        Objective:     BP 129/72   Pulse 76   Ht 5\' 3"  (1.6 m)   Wt 169 lb (76.7 kg)    LMP 02/20/2012   SpO2 100%   BMI 29.94 kg/m     Physical Exam Vitals and nursing note reviewed. Exam conducted with a chaperone present.  Constitutional:      Appearance: Normal appearance.  HENT:     Head: Normocephalic and atraumatic.     Right Ear: Tympanic membrane, ear canal and external ear normal.     Left Ear: Tympanic membrane, ear canal and external ear normal.     Nose: Nose normal.     Mouth/Throat:     Pharynx: Oropharynx is clear.  Eyes:     Extraocular Movements: Extraocular movements intact.     Conjunctiva/sclera: Conjunctivae normal.     Pupils: Pupils are equal, round, and reactive to light.  Neck:     Thyroid: No thyromegaly.  Cardiovascular:     Rate and Rhythm: Normal rate and regular rhythm.  Pulmonary:     Effort: Pulmonary effort is normal.     Breath sounds: Normal breath sounds.  Chest:     Chest wall: No mass or deformity.  Breasts:    Right: Normal. No mass, nipple discharge or skin change.     Left: Normal. No mass, nipple discharge or skin change.  Abdominal:     General: Bowel sounds are normal.     Palpations: Abdomen is soft.     Tenderness: There is no abdominal tenderness.  Genitourinary:    General: Normal vulva.     Exam position: Supine.     Pubic Area: No rash.      Labia:        Right: No rash, lesion or injury.        Left: No rash, lesion or injury.      Vagina: Normal.     Cervix: Normal.     Uterus: Normal.      Adnexa: Right adnexa normal and left adnexa normal.     Rectum: Normal.  Musculoskeletal:        General: No swelling.     Cervical back: Neck supple.  Lymphadenopathy:     Upper Body:     Right upper body: No supraclavicular, axillary or pectoral adenopathy.     Left upper body: No supraclavicular, axillary or pectoral adenopathy.  Skin:    General: Skin is warm and dry.  Neurological:     Mental Status: She is alert and oriented to person, place, and time.  Psychiatric:        Mood and Affect: Mood  normal.        Behavior: Behavior normal.      No results found for any visits on 11/07/22.      Assessment & Plan:    Routine Health Maintenance and Physical Exam  Immunization History  Administered Date(s) Administered   Influenza Split 11/09/2013   Influenza,inj,Quad PF,6+ Mos 10/30/2015, 11/05/2018   Td 03/22/2002   Tdap 10/23/2012    Health Maintenance  Topic Date Due   Cervical Cancer Screening (HPV/Pap Cotest)  10/03/2019   DTaP/Tdap/Td (3 - Td or Tdap) 10/24/2022   Hepatitis C Screening  01/04/2023 (Originally 02/10/1983)   Zoster Vaccines- Shingrix (1 of 2) 02/07/2023 (Originally 02/10/2017)   INFLUENZA VACCINE  04/21/2023 (Originally 08/22/2022)   COVID-19 Vaccine (1 - 2023-24 season) 11/23/2023 (Originally 09/22/2022)   MAMMOGRAM  08/13/2024   Colonoscopy  03/19/2028   HIV Screening  Completed   Pneumococcal Vaccine 77-84 Years old  Aged Out   HPV VACCINES  Aged Out    Discussed health benefits of physical activity, and encouraged her to engage in regular exercise appropriate for her age and condition.  Problem List Items Addressed This Visit   None Visit Diagnoses     Wellness examination    -  Primary   Relevant Orders   CMP14+EGFR   Lipid panel   CBC   Thyroid Panel With TSH   Screening for cervical cancer       Relevant Orders   Cytology - PAP       Keep up a regular exercise program and make sure you are eating a healthy diet Try to eat 4 servings of dairy a day, or if you are lactose intolerant take a calcium with vitamin D daily.  Your vaccines are up to date.  Encouraged her to consider getting her Tdap updated.  On for today. Return in about 1 year (around 11/07/2023) for Wellness Exam.     Nani Gasser, MD

## 2022-11-08 LAB — CMP14+EGFR
ALT: 18 [IU]/L (ref 0–32)
AST: 21 [IU]/L (ref 0–40)
Albumin: 4.8 g/dL (ref 3.8–4.9)
Alkaline Phosphatase: 141 [IU]/L — ABNORMAL HIGH (ref 44–121)
BUN/Creatinine Ratio: 14 (ref 9–23)
BUN: 11 mg/dL (ref 6–24)
Bilirubin Total: 0.3 mg/dL (ref 0.0–1.2)
CO2: 25 mmol/L (ref 20–29)
Calcium: 9.5 mg/dL (ref 8.7–10.2)
Chloride: 100 mmol/L (ref 96–106)
Creatinine, Ser: 0.76 mg/dL (ref 0.57–1.00)
Globulin, Total: 2.3 g/dL (ref 1.5–4.5)
Glucose: 87 mg/dL (ref 70–99)
Potassium: 4 mmol/L (ref 3.5–5.2)
Sodium: 141 mmol/L (ref 134–144)
Total Protein: 7.1 g/dL (ref 6.0–8.5)
eGFR: 92 mL/min/{1.73_m2} (ref >59)

## 2022-11-08 LAB — CBC
Hematocrit: 44.8 % (ref 34.0–46.6)
Hemoglobin: 14.7 g/dL (ref 11.1–15.9)
MCH: 31.1 pg (ref 26.6–33.0)
MCHC: 32.8 g/dL (ref 31.5–35.7)
MCV: 95 fL (ref 79–97)
Platelets: 295 10*3/uL (ref 150–450)
RBC: 4.72 x10E6/uL (ref 3.77–5.28)
RDW: 12.6 % (ref 11.7–15.4)
WBC: 5.8 10*3/uL (ref 3.4–10.8)

## 2022-11-08 LAB — THYROID PANEL WITH TSH
Free Thyroxine Index: 1.9 (ref 1.2–4.9)
T3 Uptake Ratio: 22 % — ABNORMAL LOW (ref 24–39)
T4, Total: 8.5 ug/dL (ref 4.5–12.0)
TSH: 1.73 u[IU]/mL (ref 0.450–4.500)

## 2022-11-08 LAB — LIPID PANEL
Chol/HDL Ratio: 2.8 {ratio} (ref 0.0–4.4)
Cholesterol, Total: 230 mg/dL — ABNORMAL HIGH (ref 100–199)
HDL: 81 mg/dL (ref >39)
LDL Chol Calc (NIH): 131 mg/dL — ABNORMAL HIGH (ref 0–99)
Triglycerides: 105 mg/dL (ref 0–149)
VLDL Cholesterol Cal: 18 mg/dL (ref 5–40)

## 2022-11-08 NOTE — Progress Notes (Signed)
Anna Ray, your metabolic panel overall looks good.  The alkaline phosphatase which is a liver enzyme is just slightly elevated but the additional liver functions are normal.  Will keep an eye on this and plan to recheck in 2 to 3 months.  Total cholesterol and LDL are mildly elevated.  LDL is 131 and goal is under 100.  Just encouraged her to continue to work on healthy diet and regular exercise.  Your HDL which is your good cholesterol, looks fantastic!  Thyroid panel looks good no concerning findings.  Blood count is normal.  No sign of anemia.

## 2022-11-11 LAB — CYTOLOGY - PAP
Comment: NEGATIVE
Diagnosis: NEGATIVE
High risk HPV: NEGATIVE

## 2022-11-11 NOTE — Progress Notes (Signed)
Your Pap smear is normal. You are negative for HPV as well. Repeat pap smear in 5 years.

## 2022-11-11 NOTE — Progress Notes (Signed)
Hi Anna Ray, it can come from excess tylenol intake or alcohol but most of the time it is diet that is causing some inflammation in the low. I would work high fiber (veggies) and lean proteins. Cut back on red meat and carbs, alcohol and tylenol and then we can recheck as soon as 6 weeks if you want to check it earlier.

## 2022-11-21 ENCOUNTER — Telehealth: Payer: BC Managed Care – PPO | Admitting: Physician Assistant

## 2022-11-21 DIAGNOSIS — M25512 Pain in left shoulder: Secondary | ICD-10-CM

## 2022-11-21 NOTE — Progress Notes (Signed)
   Thank you for the details you included in the comment boxes. Those details are very helpful in determining the best course of treatment for you and help [place] to provide the best care.Because we cannot fully assess or manage shoulder injury/pain via e-visit, we recommend that you schedule a Virtual Urgent Care video visit in order for the provider to better assess what is going on.  The provider will be able to give you a more accurate diagnosis and treatment plan if we can more freely discuss your symptoms and with the addition of a virtual examination.   If you change your visit to a video visit, we will bill your insurance (similar to an office visit) and you will not be charged for this e-Visit. You will be able to stay at home and speak with the first available [facility] advanced practice provider. The link to do a video visit is in the drop down Menu tab of your Welcome screen in MyChart.

## 2022-11-22 ENCOUNTER — Telehealth: Payer: BC Managed Care – PPO | Admitting: Physician Assistant

## 2022-11-22 DIAGNOSIS — M25512 Pain in left shoulder: Secondary | ICD-10-CM

## 2022-11-22 MED ORDER — METHYLPREDNISOLONE 4 MG PO TBPK
ORAL_TABLET | ORAL | 0 refills | Status: DC
Start: 2022-11-22 — End: 2023-08-07

## 2022-11-22 NOTE — Patient Instructions (Signed)
Kiely A. Littie Deeds, thank you for joining [name], PA-C for today's virtual visit.  While this provider is not your primary care provider (PCP), if your PCP is located in our provider database this encounter information will be shared with them immediately following your visit.   A [facility] MyChart account gives you access to today's visit and all your visits, tests, and labs performed at [facility] " click here if you don't have a [facility] MyChart account or go to [email or web address]  Consent: (Patient) Anna Ray provided verbal consent for this virtual visit at the beginning of the encounter.  Current Medications:  Current Outpatient Medications:    Ascorbic Acid (VITAMIN C) 100 MG tablet, Take 100 mg by mouth daily. , Disp: , Rfl:    halobetasol (ULTRAVATE) 0.05 % ointment, Apply topically daily as needed., Disp: 50 g, Rfl: 1   MAGNESIUM GLYCINATE PO, Take 1 tablet by mouth at bedtime., Disp: , Rfl:    meclizine (DRAMAMINE) 25 MG tablet, Take 1 tablet (25 mg total) by mouth 3 (three) times daily as needed for dizziness. Or nausea, Disp: 30 tablet, Rfl: 0   methylPREDNISolone (MEDROL DOSEPAK) 4 MG TBPK tablet, 6 day taper; take as directed on package instructions, Disp: 21 tablet, Rfl: 0   Medications ordered in this encounter:  Meds ordered this encounter  Medications   methylPREDNISolone (MEDROL DOSEPAK) 4 MG TBPK tablet    Sig: 6 day taper; take as directed on package instructions    Dispense:  21 tablet    Refill:  0    Order Specific Question:   Supervising Provider    Answer:   Merrilee Jansky [id number]     *If you need refills on other medications prior to your next appointment, please contact your pharmacy*  Follow-Up: Call back or seek an in-person evaluation if the symptoms worsen or if the condition fails to improve as anticipated.  [facility] [phone]  Other Instructions  Tendinitis  Tendinitis is  inflammation of a tendon. A tendon is a strong cord of tissue that connects muscle to bone. Tendinitis can affect any tendon, but it most commonly affects the: Shoulder tendon (biceps tendon or rotator cuff). Ankle tendon (Achilles tendon). Elbow tendons. Tendons in the wrist. What are the causes? This condition may be caused by: Overusing a tendon or muscle. This is the most common cause. Age-related wear and tear. Injury. Inflammatory conditions, such as arthritis. Certain medicines. What increases the risk? You are more likely to develop this condition if you do activities that involve the same movements over and over again (repetitive motions). What are the signs or symptoms? Symptoms of this condition may include: Pain. Tenderness. Mild swelling. Decreased range of motion. How is this diagnosed? This condition is diagnosed with a physical exam. You may also have tests, such as: Ultrasound. This uses sound waves to make an image of the inside of your body in the affected area. MRI. This uses magnetic fields and radio waves to make an image of the inside of your body in the affected area. How is this treated? This condition may be treated by resting, icing, applying pressure (compression), and raising (elevating) the affected area above the level of your heart. This is known as RICE therapy. Treatment may also include: Medicines to help reduce inflammation or to help reduce pain. Exercises or physical therapy to improve movement and strength of the tendon. A brace or splint. Injection of corticosteroid medicine. This  may be done in some cases. Surgery. This is rarely needed and only used if all other treatment has failed. Follow these instructions at home: If you have a removable splint or brace: Wear the splint or brace as told by your health care provider. Remove it only as told by your health care provider. Check the skin around the splint or brace every day. Tell your health  care provider about any concerns. Loosen the splint or brace if your fingers or toes tingle, become numb, or turn cold and blue. Keep the splint or brace clean. If the splint or brace is not waterproof: Do not let it get wet. Cover it with a watertight covering when you take a bath or shower, or remove it as told by your health care provider. Managing pain, stiffness, and swelling     If directed, put ice on the affected area. To do this: If you have a removable splint or brace, remove it as told by your health care provider. Put ice in a plastic bag. Place a towel between your skin and the bag. Leave the ice on for 20 minutes, 2-3 times a day. Remove the ice if your skin turns bright red. This is very important. If you cannot feel pain, heat, or cold, you have a greater risk of damage to the area. Move the fingers or toes of the affected limb often, if this applies. This can help to reduce stiffness and swelling. If directed, elevate the affected area above the level of your heart while you are sitting or lying down. If directed, apply heat to the affected area before you exercise. Use the heat source that your health care provider recommends, such as a moist heat pack or a heating pad. Place a towel between your skin and the heat source. Leave the heat on for 20-30 minutes. Remove the heat if your skin turns bright red. This is especially important if you are unable to feel pain, heat, or cold. You have a greater risk of getting burned. Activity Rest the affected area as told by your health care provider. Ask your health care provider when it is safe to drive if you have a splint or brace on any part of your arm or leg. Return to your normal activities as told by your health care provider. Ask your health care provider what activities are safe for you. Avoid using the affected area while you are experiencing symptoms of tendinitis. Do exercises as told by your health care  provider. General instructions Wear an elastic bandage or compression wrap only as told by your health care provider. Take over-the-counter and prescription medicines only as told by your health care provider. Keep all follow-up visits. This is important. Contact a health care provider if: Your symptoms do not improve. You develop new, unexplained problems, such as numbness in your hands or feet. Summary Tendinitis is inflammation of a tendon. You are more likely to develop this condition if you do activities that involve the same movements over and over again. This condition may be treated by resting, icing, applying pressure (compression), and elevating the area above the level of your heart. This is known as RICE therapy. Avoid using the affected area while you are experiencing symptoms of tendinitis. This information is not intended to replace advice given to you by your health care provider. Make sure you discuss any questions you have with your health care provider. Document Revised: 09/14/2020 Document Reviewed: 09/14/2020 Elsevier Patient Education  2024 Elsevier  Inc.   Shoulder Exercises Ask your health care provider which exercises are safe for you. Do exercises exactly as told by your health care provider and adjust them as directed. It is normal to feel mild stretching, pulling, tightness, or discomfort as you do these exercises. Stop right away if you feel sudden pain or your pain gets worse. Do not begin these exercises until told by your health care provider. Stretching exercises External rotation and abduction This exercise is sometimes called corner stretch. The exercise rotates your arm outward (external rotation) and moves your arm out from your body (abduction). Stand in a doorway with one of your feet slightly in front of the other. This is called a staggered stance. If you cannot reach your forearms to the door frame, stand facing a corner of a room. Choose one of the  following positions as told by your health care provider: Place your hands and forearms on the door frame above your head. Place your hands and forearms on the door frame at the height of your head. Place your hands on the door frame at the height of your elbows. Slowly move your weight onto your front foot until you feel a stretch across your chest and in the front of your shoulders. Keep your head and chest upright and keep your abdominal muscles tight. Hold for __________ seconds. To release the stretch, shift your weight to your back foot. Repeat __________ times. Complete this exercise __________ times a day. Extension, standing  Stand and hold a broomstick, a cane, or a similar object behind your back. Your hands should be a little wider than shoulder-width apart. Your palms should face away from your back. Keeping your elbows straight and your shoulder muscles relaxed, move the stick away from your body until you feel a stretch in your shoulders (extension). Avoid shrugging your shoulders while you move the stick. Keep your shoulder blades tucked down toward the middle of your back. Hold for __________ seconds. Slowly return to the starting position. Repeat __________ times. Complete this exercise __________ times a day. Range-of-motion exercises Pendulum  Stand near a wall or a surface that you can hold onto for balance. Bend at the waist and let your left / right arm hang straight down. Use your other arm to support you. Keep your back straight and do not lock your knees. Relax your left / right arm and shoulder muscles, and move your hips and your trunk so your left / right arm swings freely. Your arm should swing because of the motion of your body, not because you are using your arm or shoulder muscles. Keep moving your hips and trunk so your arm swings in the following directions, as told by your health care provider: Side to side. Forward and backward. In clockwise and  counterclockwise circles. Continue each motion for __________ seconds, or for as long as told by your health care provider. Slowly return to the starting position. Repeat __________ times. Complete this exercise __________ times a day. Shoulder flexion, standing  Stand and hold a broomstick, a cane, or a similar object. Place your hands a little more than shoulder-width apart on the object. Your left / right hand should be palm-up, and your other hand should be palm-down. Keep your elbow straight and your shoulder muscles relaxed. Push the stick up with your healthy arm to raise your left / right arm in front of your body, and then over your head until you feel a stretch in your shoulder (flexion).  Avoid shrugging your shoulder while you raise your arm. Keep your shoulder blade tucked down toward the middle of your back. Hold for __________ seconds. Slowly return to the starting position. Repeat __________ times. Complete this exercise __________ times a day. Shoulder abduction, standing  Stand and hold a broomstick, a cane, or a similar object. Place your hands a little more than shoulder-width apart on the object. Your left / right hand should be palm-up, and your other hand should be palm-down. Keep your elbow straight and your shoulder muscles relaxed. Push the object across your body toward your left / right side. Raise your left / right arm to the side of your body (abduction) until you feel a stretch in your shoulder. Do not raise your arm above shoulder height unless your health care provider tells you to do that. If directed, raise your arm over your head. Avoid shrugging your shoulder while you raise your arm. Keep your shoulder blade tucked down toward the middle of your back. Hold for __________ seconds. Slowly return to the starting position. Repeat __________ times. Complete this exercise __________ times a day. Internal rotation  Place your left / right hand behind your back,  palm-up. Use your other hand to dangle an exercise band, a broomstick, or a similar object over your shoulder. Grasp the band with your left / right hand so you are holding on to both ends. Gently pull up on the band until you feel a stretch in the front of your left / right shoulder. The movement of your arm toward the center of your body is called internal rotation. Avoid shrugging your shoulder while you raise your arm. Keep your shoulder blade tucked down toward the middle of your back. Hold for __________ seconds. Release the stretch by letting go of the band and lowering your hands. Repeat __________ times. Complete this exercise __________ times a day. Strengthening exercises External rotation  Sit in a stable chair without armrests. Secure an exercise band to a stable object at elbow height on your left / right side. Place a soft object, such as a folded towel or a small pillow, between your left / right upper arm and your body to move your elbow about 4 inches (10 cm) away from your side. Hold the end of the exercise band so it is tight and there is no slack. Keeping your elbow pressed against the soft object, slowly move your forearm out, away from your abdomen (external rotation). Keep your body steady so only your forearm moves. Hold for __________ seconds. Slowly return to the starting position. Repeat __________ times. Complete this exercise __________ times a day. Shoulder abduction  Sit in a stable chair without armrests, or stand up. Hold a __________ lb / kg weight in your left / right hand, or hold an exercise band with both hands. Start with your arms straight down and your left / right palm facing in, toward your body. Slowly lift your left / right hand out to your side (abduction). Do not lift your hand above shoulder height unless your health care provider tells you that this is safe. Keep your arms straight. Avoid shrugging your shoulder while you do this movement.  Keep your shoulder blade tucked down toward the middle of your back. Hold for __________ seconds. Slowly lower your arm, and return to the starting position. Repeat __________ times. Complete this exercise __________ times a day. Shoulder extension  Sit in a stable chair without armrests, or stand up. Secure an  exercise band to a stable object in front of you so it is at shoulder height. Hold one end of the exercise band in each hand. Straighten your elbows and lift your hands up to shoulder height. Squeeze your shoulder blades together as you pull your hands down to the sides of your thighs (extension). Stop when your hands are straight down by your sides. Do not let your hands go behind your body. Hold for __________ seconds. Slowly return to the starting position. Repeat __________ times. Complete this exercise __________ times a day. Shoulder row  Sit in a stable chair without armrests, or stand up. Secure an exercise band to a stable object in front of you so it is at chest height. Hold one end of the exercise band in each hand. Position your palms so that your thumbs are facing the ceiling (neutral position). Bend each of your elbows to a 90-degree angle (right angle) and keep your upper arms at your sides. Step back or move the chair back until the band is tight and there is no slack. Slowly pull your elbows back behind you. Hold for __________ seconds. Slowly return to the starting position. Repeat __________ times. Complete this exercise __________ times a day. Shoulder press-ups  Sit in a stable chair that has armrests. Sit upright, with your feet flat on the floor. Put your hands on the armrests so your elbows are bent and your fingers are pointing forward. Your hands should be about even with the sides of your body. Push down on the armrests and use your arms to lift yourself off the chair. Straighten your elbows and lift yourself up as much as you comfortably can. Move your  shoulder blades down, and avoid letting your shoulders move up toward your ears. Keep your feet on the ground. As you get stronger, your feet should support less of your body weight as you lift yourself up. Hold for __________ seconds. Slowly lower yourself back into the chair. Repeat __________ times. Complete this exercise __________ times a day. Wall push-ups  Stand so you are facing a stable wall. Your feet should be about one arm-length away from the wall. Lean forward and place your palms on the wall at shoulder height. Keep your feet flat on the floor as you bend your elbows and lean forward toward the wall. Hold for __________ seconds. Straighten your elbows to push yourself back to the starting position. Repeat __________ times. Complete this exercise __________ times a day. This information is not intended to replace advice given to you by your health care provider. Make sure you discuss any questions you have with your health care provider. Document Revised: 02/27/2021 Document Reviewed: 02/27/2021 Elsevier Patient Education  2024 Elsevier Inc.    If you have been instructed to have an in-person evaluation today at a local Urgent Care facility, please use the link below. It will take you to a list of all of our available [facility], including address, phone number and hours of operation. Please do not delay care.  [facility]  If you or a family member do not have a primary care provider, use the link below to schedule a visit and establish care. When you choose a [facility] primary care physician or advanced practice provider, you gain a long-term partner in health. Find a Primary Care Provider  Learn more about [facility]'s in-office and virtual care options: [facility] - Get Care Now

## 2022-11-22 NOTE — Progress Notes (Signed)
Virtual Visit Consent   [name], you are scheduled for a virtual visit with a [facility] provider today. Just as with appointments in the office, your consent must be obtained to participate. Your consent will be active for this visit and any virtual visit you may have with one of our providers in the next 365 days. If you have a MyChart account, a copy of this consent can be sent to you electronically.  As this is a virtual visit, video technology does not allow for your provider to perform a traditional examination. This may limit your provider's ability to fully assess your condition. If your provider identifies any concerns that need to be evaluated in person or the need to arrange testing (such as labs, EKG, etc.), we will make arrangements to do so. Although advances in technology are sophisticated, we cannot ensure that it will always work on either your end or our end. If the connection with a video visit is poor, the visit may have to be switched to a telephone visit. With either a video or telephone visit, we are not always able to ensure that we have a secure connection.  By engaging in this virtual visit, you consent to the provision of healthcare and authorize for your insurance to be billed (if applicable) for the services provided during this visit. Depending on your insurance coverage, you may receive a charge related to this service.  I need to obtain your verbal consent now. Are you willing to proceed with your visit today? Anna Ray has provided verbal consent on 11/22/2022 for a virtual visit (video or telephone). [name], PA-C  Date: 11/22/2022 7:50 AM  Virtual Visit via Video Note   I, [name], connected with  Anna Ray  ([id number], 02/13/1965) on 11/22/22 at  7:45 AM EDT by a video-enabled telemedicine application and verified that I am speaking with the correct person using two identifiers.  Location: Patient: Virtual Visit  Location Patient: Home Provider: Virtual Visit Location Provider: Home Office   I discussed the limitations of evaluation and management by telemedicine and the availability of in person appointments. The patient expressed understanding and agreed to proceed.    History of Present Illness: Anna Ray is a 57 y.o. who identifies as a female who was assigned female at birth, and is being seen today for left shoulder pain.  HPI: Shoulder Pain  The pain is present in the left shoulder. The current episode started yesterday. There has been no history of extremity trauma. The problem occurs constantly. The problem has been gradually worsening. The quality of the pain is described as dull and aching. The patient is experiencing no pain. Associated symptoms include a limited range of motion (feels pull and). Pertinent negatives include no fever, inability to bear weight, itching, joint locking, joint swelling, numbness or tingling. The symptoms are aggravated by activity. She has tried NSAIDS, OTC ointments and cold (biofreeze) for the symptoms. The treatment provided no relief. Family history does not include gout or rheumatoid arthritis. Her past medical history is significant for osteoarthritis.    Seen 09/26/22 virtually for similar issue in right shoulder and treated with a Medrol dose pack.   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 , Disp: , Rfl:    halobetasol (ULTRAVATE) 0.05 % ointment, Apply topically daily as needed., Disp: 50 g, Rfl: 1   MAGNESIUM GLYCINATE PO, Take 1 tablet by mouth at bedtime., Disp: , Rfl:    meclizine (DRAMAMINE) 25 MG tablet, Take 1 tablet (25 mg total) by mouth 3 (three) times daily as needed for dizziness. Or nausea, Disp: 30 tablet, Rfl: 0   methylPREDNISolone (MEDROL DOSEPAK) 4 MG TBPK tablet, 6 day taper; take as directed on package instructions, Disp: 21 tablet, Rfl: 0  Observations/Objective: Patient is well-developed, well-nourished in no acute distress.  Resting comfortably at home.  Head is normocephalic, atraumatic.  No labored breathing.  Speech is clear and coherent with logical content.  Patient is alert and oriented at baseline.    Assessment and Plan: 1. Acute pain of left shoulder - methylPREDNISolone (MEDROL DOSEPAK) 4 MG TBPK tablet; 6 day taper; take as directed on package instructions  Dispense: 21 tablet; Refill: 0  - Suspect possible biceps tendonitis or strain of left shoulder - Failed NSAIDs - Add Medrol dose pack and flexeril - Avoid NSAIDs (Ibuprofen/Advil/Motrin or Naproxen/Aleve) while on steroid - Tylenol if okay for breakthrough pain - Can alternate heat and ice (start with heat, end with ice) - Biofreeze is okay to continue - Seek in person evaluation if worsening or fails to improve with treatment   Follow Up Instructions: I discussed the assessment and treatment plan with the patient. The patient was provided an opportunity to ask questions and all were answered. The patient agreed with the plan and demonstrated an understanding of the instructions.  A copy of instructions were sent to the patient via MyChart unless otherwise noted below.    The patient was advised to call back or seek an in-person evaluation if the symptoms worsen or if the  condition fails to improve as anticipated.    [name], PA-C

## 2023-02-03 ENCOUNTER — Telehealth: Payer: BC Managed Care – PPO | Admitting: Physician Assistant

## 2023-02-03 DIAGNOSIS — J069 Acute upper respiratory infection, unspecified: Secondary | ICD-10-CM | POA: Diagnosis not present

## 2023-02-03 DIAGNOSIS — B9689 Other specified bacterial agents as the cause of diseases classified elsewhere: Secondary | ICD-10-CM | POA: Diagnosis not present

## 2023-02-03 MED ORDER — BENZONATATE 100 MG PO CAPS
100.0000 mg | ORAL_CAPSULE | Freq: Three times a day (TID) | ORAL | 0 refills | Status: DC | PRN
Start: 2023-02-03 — End: 2023-08-07

## 2023-02-03 MED ORDER — AMOXICILLIN-POT CLAVULANATE 875-125 MG PO TABS
1.0000 | ORAL_TABLET | Freq: Two times a day (BID) | ORAL | 0 refills | Status: DC
Start: 2023-02-03 — End: 2023-08-07

## 2023-02-03 MED ORDER — FLUTICASONE PROPIONATE 50 MCG/ACT NA SUSP
2.0000 | Freq: Every day | NASAL | 0 refills | Status: DC
Start: 2023-02-03 — End: 2023-08-07

## 2023-02-03 NOTE — Progress Notes (Signed)
 E-Visit for Sinus Problems  We are sorry that you are not feeling well.  Here is how we plan to help!  Based on what you have shared with me it looks like you have sinusitis.  Sinusitis is inflammation and infection in the sinus cavities of the head.  Based on your presentation I believe you most likely have Acute Bacterial Sinusitis.  This is an infection caused by bacteria and is treated with antibiotics. I have prescribed Augmentin  875mg /125mg  one tablet twice daily with food, for 7 days.  I have also prescribed Fluticasone  nasal spray for the nasal congestion Use 2 sprays in each nostril daily for 7-10 days. I have prescribed Tessalon  perles Take 1-2 capsules every 8 hours as needed for cough. You may use an oral decongestant such as Mucinex D or if you have glaucoma or high blood pressure use plain Mucinex. Saline nasal spray help and can safely be used as often as needed for congestion.  If you develop worsening sinus pain, fever or notice severe headache and vision changes, or if symptoms are not better after completion of antibiotic, please schedule an appointment with a health care provider.    Sinus infections are not as easily transmitted as other respiratory infection, however we still recommend that you avoid close contact with loved ones, especially the very young and elderly.  Remember to wash your hands thoroughly throughout the day as this is the number one way to prevent the spread of infection!  Home Care: Only take medications as instructed by your medical team. Complete the entire course of an antibiotic. Do not take these medications with alcohol. A steam or ultrasonic humidifier can help congestion.  You can place a towel over your head and breathe in the steam from hot water coming from a faucet. Avoid close contacts especially the very young and the elderly. Cover your mouth when you cough or sneeze. Always remember to wash your hands.  Get Help Right Away If: You develop  worsening fever or sinus pain. You develop a severe head ache or visual changes. Your symptoms persist after you have completed your treatment plan.  Make sure you Understand these instructions. Will watch your condition. Will get help right away if you are not doing well or get worse.  Thank you for choosing an e-visit.  Your e-visit answers were reviewed by a board certified advanced clinical practitioner to complete your personal care plan. Depending upon the condition, your plan could have included both over the counter or prescription medications.  Please review your pharmacy choice. Make sure the pharmacy is open so you can pick up prescription now. If there is a problem, you may contact your provider through [employer] and have the prescription routed to another pharmacy.  Your safety is important to us . If you have drug allergies check your prescription carefully.   For the next 24 hours you can use MyChart to ask questions about today's visit, request a non-urgent call back, or ask for a work or school excuse. You will get an email in the next two days asking about your experience. I hope that your e-visit has been valuable and will speed your recovery.   I have spent 5 minutes in review of e-visit questionnaire, review and updating patient chart, medical decision making and response to patient.   Delon CHRISTELLA Dickinson, PA-C

## 2023-08-04 ENCOUNTER — Ambulatory Visit (INDEPENDENT_AMBULATORY_CARE_PROVIDER_SITE_OTHER): Admitting: Obstetrics & Gynecology

## 2023-08-04 ENCOUNTER — Other Ambulatory Visit (HOSPITAL_COMMUNITY)
Admission: RE | Admit: 2023-08-04 | Discharge: 2023-08-04 | Disposition: A | Discharge status: Home / Self Care | Source: Ambulatory Visit | Attending: Obstetrics & Gynecology | Admitting: Obstetrics & Gynecology

## 2023-08-04 ENCOUNTER — Encounter: Payer: Self-pay | Admitting: Obstetrics & Gynecology

## 2023-08-04 VITALS — BP 130/81 | HR 90 | Wt 173.0 lb

## 2023-08-04 DIAGNOSIS — N898 Other specified noninflammatory disorders of vagina: Secondary | ICD-10-CM | POA: Insufficient documentation

## 2023-08-04 DIAGNOSIS — Z01419 Encounter for gynecological examination (general) (routine) without abnormal findings: Secondary | ICD-10-CM

## 2023-08-04 DIAGNOSIS — Z780 Asymptomatic menopausal state: Secondary | ICD-10-CM | POA: Diagnosis not present

## 2023-08-04 NOTE — Progress Notes (Unsigned)
 Last pap smear (date and result):11/07/22 Last mammogram (date and result):08/15/22 Last colon screening (date and result):03/19/18 Brush:yes Floss:yes Seatbelts: yes Sunscreen: yes

## 2023-08-04 NOTE — Progress Notes (Unsigned)
 Subjective:     Anna Ray is a 58 y.o. female here for a routine exam.  Current complaints: none--No menses for 10 years since ablation.  Elevated FSH in 2013   Gynecologic History Patient's last menstrual period was 02/20/2012. Contraception: post menopausal status Last Pap: 2024. Results were: normal Last mammogram: 08/15/2022. Results were: normal  Obstetric History OB History  Gravida Para Term Preterm AB Living  1 1 1      SAB IAB Ectopic Multiple Live Births          # Outcome Date GA Lbr Len/2nd Weight Sex Type Anes PTL Lv  1 Term              The following portions of the patient's history were reviewed and updated as appropriate: allergies, current medications, past family history, past medical history, past social history, past surgical history, and problem list.  Review of Systems Pertinent items noted in HPI and remainder of comprehensive ROS otherwise negative.    Objective:    Vitals:  WNL General appearance: alert, cooperative and no distress  HEENT: Normocephalic, without obvious abnormality, atraumatic Eyes: negative Throat: lips, mucosa, and tongue normal; teeth and gums normal  Respiratory: Clear to auscultation bilaterally  CV: Regular rate and rhythm  Breasts:  Normal appearance, no masses or tenderness, no nipple retraction or dimpling  GI: Soft, non-tender; bowel sounds normal; no masses,  no organomegaly  GU: External Genitalia:  Tanner V, no lesion Urethra:  No prolapse   Vagina: Pink, normal rugae, no blood or discharge  Cervix: No CMT, no lesion  Uterus:  Normal size and contour, non tender  Adnexa: Normal, no masses, non tender  Musculoskeletal: No edema, redness or tenderness in the calves or thighs  Skin: No lesions or rash  Lymphatic: Axillary adenopathy: none     Psychiatric: Normal mood and behavior        Assessment:    Healthy female exam.    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 Loretha last note.  Bone Health--Dexa ordered.  Maximize calcium and vitamin D .  Weight bearing exercise.  Vaginal itching--reviewed vulvar hygiene and aptima sent for vaginitis panel.

## 2023-08-05 LAB — CERVICOVAGINAL ANCILLARY ONLY
Bacterial Vaginitis (gardnerella): NEGATIVE
Candida Glabrata: NEGATIVE
Candida Vaginitis: NEGATIVE
Comment: NEGATIVE
Comment: NEGATIVE
Comment: NEGATIVE

## 2023-08-06 ENCOUNTER — Ambulatory Visit: Payer: Self-pay | Admitting: Obstetrics & Gynecology

## 2023-09-15 ENCOUNTER — Other Ambulatory Visit: Payer: Self-pay | Admitting: Family Medicine

## 2023-09-15 DIAGNOSIS — Z1231 Encounter for screening mammogram for malignant neoplasm of breast: Secondary | ICD-10-CM

## 2023-10-09 ENCOUNTER — Ambulatory Visit (INDEPENDENT_AMBULATORY_CARE_PROVIDER_SITE_OTHER)

## 2023-10-09 DIAGNOSIS — Z1231 Encounter for screening mammogram for malignant neoplasm of breast: Secondary | ICD-10-CM

## 2023-10-13 ENCOUNTER — Ambulatory Visit: Payer: Self-pay | Admitting: Family Medicine

## 2023-10-13 NOTE — Progress Notes (Signed)
 Please call patient. Normal mammogram.  Repeat in 1 year.

## 2023-11-10 ENCOUNTER — Encounter: Payer: Self-pay | Admitting: Family Medicine

## 2023-11-10 ENCOUNTER — Ambulatory Visit: Admitting: Family Medicine

## 2023-11-10 VITALS — BP 130/76 | HR 74 | Ht 63.0 in | Wt 178.5 lb

## 2023-11-10 DIAGNOSIS — Z0000 Encounter for general adult medical examination without abnormal findings: Secondary | ICD-10-CM

## 2023-11-10 DIAGNOSIS — Z1159 Encounter for screening for other viral diseases: Secondary | ICD-10-CM

## 2023-11-10 DIAGNOSIS — Z9189 Other specified personal risk factors, not elsewhere classified: Secondary | ICD-10-CM | POA: Diagnosis not present

## 2023-11-10 DIAGNOSIS — L2089 Other atopic dermatitis: Secondary | ICD-10-CM

## 2023-11-10 MED ORDER — HALOBETASOL PROPIONATE 0.05 % EX OINT: TOPICAL_OINTMENT | Freq: Every day | CUTANEOUS | 1 refills | Status: AC | PRN

## 2023-11-10 NOTE — Patient Instructions (Signed)
 Can consider for weight loss:  [facility] Program with Dr. Darice Haddock and Dr. Clayborne Daring.   Zepbound, Wegovy, Saxenda Contrave, Qsymia,

## 2023-11-10 NOTE — Progress Notes (Signed)
 Complete physical exam  Patient: Anna Ray   DOB: July 14, 1965   58 y.o. Female  MRN: [id number]  Subjective:    Chief Complaint  Patient presents with   Annual Exam    Anna Ray is a 58 y.o. female who presents today for a complete physical exam. She reports consuming a general diet. Walking and weight lisfting.  She generally feels well.  She does not have additional problems to discuss today.    Most recent fall risk assessment:    11/07/2022    8:52 AM  Fall Risk   Falls in the past year? 0  Number falls in past yr: 0  Injury with Fall? 0  Risk for fall due to : No Fall Risks  Follow up Falls evaluation completed     Most recent depression screenings:    11/07/2022    8:52 AM 01/03/2022   10:41 AM  PHQ 2/9 Scores  PHQ - 2 Score 0 0         Patient Care Team: Alvan Dorothyann BIRCH, MD as PCP - General   Outpatient Medications Prior to Visit  Medication Sig   Ascorbic Acid (VITAMIN C) 100 MG tablet Take 100 mg by mouth daily.    halobetasol  (ULTRAVATE ) 0.05 % ointment Apply topically daily as needed.   MAGNESIUM GLYCINATE PO Take 1 tablet by mouth at bedtime.   No facility-administered medications prior to visit.    ROS        Objective:     BP 130/76   Pulse 74   Ht 5' 3 (1.6 m)   Wt 178 lb 8 oz (81 kg)   LMP 02/20/2012   SpO2 96%   BMI 31.62 kg/m     Physical Exam Vitals and nursing note reviewed.  Constitutional:      Appearance: Normal appearance.  HENT:     Head: Normocephalic and atraumatic.  Eyes:     Conjunctiva/sclera: Conjunctivae normal.  Cardiovascular:     Rate and Rhythm: Normal rate and regular rhythm.  Pulmonary:     Effort: Pulmonary effort is normal.     Breath sounds: Normal breath sounds.  Skin:    General: Skin is warm and dry.  Neurological:     Mental Status: She is alert.  Psychiatric:        Mood and Affect: Mood normal.      No results found for any visits on 11/10/23.       Assessment & Plan:    Routine Health Maintenance and Physical Exam  Immunization History  Administered Date(s) Administered   Influenza Split 11/09/2013   Influenza,inj,Quad PF,6+ Mos 10/30/2015, 11/05/2018   Td 03/22/2002   Tdap 10/23/2012    Health Maintenance  Topic Date Due   Hepatitis C Screening  Never done   Hepatitis B Vaccines 19-59 Average Risk (1 of 3 - 19+ 3-dose series) Never done   Pneumococcal Vaccine: 50+ Years (1 of 1 - PCV) Never done   Zoster Vaccines- Shingrix (1 of 2) Never done   DTaP/Tdap/Td (3 - Td or Tdap) 10/24/2022   COVID-19 Vaccine (1 - 2025-26 season) Never done   Influenza Vaccine  04/20/2024 (Originally 08/22/2023)   Mammogram  10/08/2025   Cervical Cancer Screening (HPV/Pap Cotest)  11/07/2027   Colonoscopy  03/19/2028   HIV Screening  Completed   HPV VACCINES  Aged Out   Meningococcal B Vaccine  Aged Out    Discussed health benefits of physical activity, and encouraged  her to engage in regular exercise appropriate for her age and condition.  Problem List Items Addressed This Visit   None Visit Diagnoses       Wellness examination    -  Primary   Relevant Orders   CBC   Lipid Panel With LDL/HDL Ratio   CMP14+EGFR   TSH   Hepatitis C Antibody     Encounter for hepatitis C virus screening test for high risk patient       Relevant Orders   Hepatitis C Antibody      Return in about 1 year (around 11/09/2024) for Wellness Exam.   Discussed vaccines due for encouraged her to consider.   Dorothyann Byars, MD

## 2023-11-10 NOTE — Addendum Note (Signed)
 Addended by: FREYA BASCOM CROME on: 11/10/2023 07:50 AM   Modules accepted: Orders

## 2023-11-10 NOTE — Addendum Note (Signed)
 Addended by: FREYA BASCOM CROME on: 11/10/2023 07:52 AM   Modules accepted: Orders

## 2023-11-11 ENCOUNTER — Ambulatory Visit: Payer: Self-pay | Admitting: Family Medicine

## 2023-11-11 DIAGNOSIS — L2089 Other atopic dermatitis: Secondary | ICD-10-CM

## 2023-11-11 LAB — CMP14+EGFR
ALT: 16 IU/L (ref 0–32)
AST: 19 IU/L (ref 0–40)
Albumin: 4.4 g/dL (ref 3.8–4.9)
Alkaline Phosphatase: 137 IU/L — ABNORMAL HIGH (ref 49–135)
BUN/Creatinine Ratio: 16 (ref 9–23)
BUN: 11 mg/dL (ref 6–24)
Bilirubin Total: 0.5 mg/dL (ref 0.0–1.2)
CO2: 24 mmol/L (ref 20–29)
Calcium: 9.5 mg/dL (ref 8.7–10.2)
Chloride: 100 mmol/L (ref 96–106)
Creatinine, Ser: 0.7 mg/dL (ref 0.57–1.00)
Globulin, Total: 2.3 g/dL (ref 1.5–4.5)
Glucose: 89 mg/dL (ref 70–99)
Potassium: 4.3 mmol/L (ref 3.5–5.2)
Sodium: 139 mmol/L (ref 134–144)
Total Protein: 6.7 g/dL (ref 6.0–8.5)
eGFR: 101 mL/min/1.73 (ref >59)

## 2023-11-11 LAB — LIPID PANEL WITH LDL/HDL RATIO
Cholesterol, Total: 195 mg/dL (ref 100–199)
HDL: 64 mg/dL (ref >39)
LDL Chol Calc (NIH): 105 mg/dL — ABNORMAL HIGH (ref 0–99)
LDL/HDL Ratio: 1.6 ratio (ref 0.0–3.2)
Triglycerides: 152 mg/dL — ABNORMAL HIGH (ref 0–149)
VLDL Cholesterol Cal: 26 mg/dL (ref 5–40)

## 2023-11-11 LAB — TSH: TSH: 2.72 u[IU]/mL (ref 0.450–4.500)

## 2023-11-11 LAB — CBC
Hematocrit: 44.8 % (ref 34.0–46.6)
Hemoglobin: 14.7 g/dL (ref 11.1–15.9)
MCH: 31.5 pg (ref 26.6–33.0)
MCHC: 32.8 g/dL (ref 31.5–35.7)
MCV: 96 fL (ref 79–97)
Platelets: 274 x10E3/uL (ref 150–450)
RBC: 4.66 x10E6/uL (ref 3.77–5.28)
RDW: 12.2 % (ref 11.7–15.4)
WBC: 4.9 x10E3/uL (ref 3.4–10.8)

## 2023-11-11 LAB — HEPATITIS C ANTIBODY: Hep C Virus Ab: NONREACTIVE

## 2023-11-11 NOTE — Progress Notes (Signed)
 Hi Ailyne, triglycerides are up this time even though your LDL looks better.  Overall total cholesterol looks good.  The alkaline phosphatase which is a liver enzyme was elevated a little bit a year ago it does look a little bit better but still slightly out of range only by 2 points so not worrisome.  Blood count looks good no anemia.  Thyroid  level looks great.  Negative for hepatitis C.

## 2023-11-18 MED ORDER — TACROLIMUS 0.03 % EX OINT: TOPICAL_OINTMENT | Freq: Two times a day (BID) | CUTANEOUS | 3 refills | Status: AC

## 2023-12-03 ENCOUNTER — Ambulatory Visit (INDEPENDENT_AMBULATORY_CARE_PROVIDER_SITE_OTHER)

## 2023-12-03 DIAGNOSIS — Z13820 Encounter for screening for osteoporosis: Secondary | ICD-10-CM

## 2023-12-03 DIAGNOSIS — M85852 Other specified disorders of bone density and structure, left thigh: Secondary | ICD-10-CM | POA: Diagnosis not present

## 2023-12-03 DIAGNOSIS — Z780 Asymptomatic menopausal state: Secondary | ICD-10-CM

## 2023-12-03 DIAGNOSIS — M85851 Other specified disorders of bone density and structure, right thigh: Secondary | ICD-10-CM | POA: Diagnosis not present

## 2023-12-11 ENCOUNTER — Ambulatory Visit (INDEPENDENT_AMBULATORY_CARE_PROVIDER_SITE_OTHER): Admitting: Nurse Practitioner

## 2023-12-11 ENCOUNTER — Encounter: Payer: Self-pay | Admitting: Nurse Practitioner

## 2023-12-11 VITALS — BP 131/85 | HR 80 | Temp 98.1°F | Ht 63.0 in | Wt 174.0 lb

## 2023-12-11 DIAGNOSIS — Z6830 Body mass index (BMI) 30.0-30.9, adult: Secondary | ICD-10-CM | POA: Diagnosis not present

## 2023-12-11 DIAGNOSIS — E66811 Obesity, class 1: Secondary | ICD-10-CM

## 2023-12-11 DIAGNOSIS — E7849 Other hyperlipidemia: Secondary | ICD-10-CM

## 2023-12-11 NOTE — Progress Notes (Addendum)
 Office: [phone]  /  Fax: [phone]   Initial Visit  Anna Ray was seen in clinic today to evaluate for obesity. She is interested in losing weight to improve overall health and reduce the risk of weight related complications. She presents today to review program treatment options, initial physical assessment, and evaluation.     She was referred by: PCP  When asked what else they would like to accomplish? She states: Adopt a healthier eating pattern and lifestyle, Improve energy levels and physical activity, Improve existing medical conditions, and Improve quality of life  When asked how has your weight affected you? 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, HLD  Contributing factors: family history of obesity, use of obesogenic medications: Steroids, moderate to high levels of stress, menopause, sedentary job, and hectic pace of life  Weight promoting medications identified: Steroids  Current nutrition plan: Portion control / smart choices  Current level of physical activity: resistance training 3 days per week and walking 3 days per week  Current or previous pharmacotherapy: Phentermine   Response to medication: stopped due to side effects of palpitations     Past medical history includes:   Past Medical History:  Diagnosis Date   ALLERGIC RHINITIS, SEASONAL 12/03/2005   Qualifier: Diagnosis of  By: Alvan MD, Catherine     Cervical dysplasia, moderate    cryo in her 60s   DERMATITIS, OTHER ATOPIC 05/23/2006   Qualifier: Diagnosis of  By: Alvan MD, Catherine       Objective:   BP 131/85   Pulse 80   Temp 98.1 F (36.7 C)   Ht 5' 3 (1.6 m)   Wt 174 lb (78.9 kg)   LMP 02/20/2012   SpO2 97%   BMI 30.82 kg/m  She was weighed on the bioimpedance scale: Body mass index is 30.82 kg/m.  Peak Weight:178 lbs , Body  Fat%:40%, Visceral Fat Rating:10, Weight trend over the last 12 months: Increasing  General:  Alert, oriented and cooperative. Patient is in no acute distress.  Respiratory: Normal respiratory effort, no problems with respiration noted   Gait: able to ambulate independently  Mental Status: Normal mood and affect. Normal behavior. Normal judgment and thought content.   DIAGNOSTIC DATA REVIEWED:  BMET    Component Value Date/Time   NA 139 11/10/2023 0903   K 4.3 11/10/2023 0903   CL 100 11/10/2023 0903   CO2 24 11/10/2023 0903   GLUCOSE 89 11/10/2023 0903   GLUCOSE 81 01/03/2021 0000   BUN 11 11/10/2023 0903   CREATININE 0.70 11/10/2023 0903   CREATININE 0.68 01/03/2021 0000   CALCIUM 9.5 11/10/2023 0903   GFRNONAA 101 01/11/2020 0712   GFRAA 117 01/11/2020 0712   Lab Results  Component Value Date   HGBA1C 5.6 06/29/2013   HGBA1C 5.5 08/18/2009   No results found for: INSULIN CBC    Component Value Date/Time   WBC 4.9 11/10/2023 0903   WBC 5.4 01/03/2021 0000   RBC 4.66 11/10/2023 0903   RBC 4.93 01/03/2021 0000   HGB 14.7 11/10/2023 0903   HCT 44.8 11/10/2023 0903   PLT 274 11/10/2023 0903   MCV 96 11/10/2023 0903   MCH 31.5 11/10/2023 0903   MCH 30.2 01/03/2021 0000   MCHC 32.8 11/10/2023 0903   MCHC 32.6 01/03/2021 0000   RDW 12.2 11/10/2023 0903   Iron/TIBC/Ferritin/ %Sat  Component Value Date/Time   FERRITIN 100 09/27/2016 0831   Lipid Panel     Component Value Date/Time   CHOL 195 11/10/2023 0903   TRIG 152 (H) 11/10/2023 0903   HDL 64 11/10/2023 0903   CHOLHDL 2.8 11/07/2022 0921   CHOLHDL 2.7 01/03/2021 0000   VLDL 16 10/18/2015 0805   LDLCALC 105 (H) 11/10/2023 0903   LDLCALC 99 01/03/2021 0000   Hepatic Function Panel     Component Value Date/Time   PROT 6.7 11/10/2023 0903   ALBUMIN 4.4 11/10/2023 0903   AST 19 11/10/2023 0903   ALT 16 11/10/2023 0903   ALKPHOS 137 (H) 11/10/2023 0903   BILITOT 0.5 11/10/2023 0903      Component  Value Date/Time   TSH 2.720 11/10/2023 0903     Assessment and Plan:   Other hyperlipidemia Will continue to monitor.    Obesity, Class I, BMI 30-34.9        Obesity Treatment / Action Plan:  Patient will work on garnering support from family and friends to begin weight loss journey. Will work on eliminating or reducing the presence of highly palatable, calorie dense foods in the home. Will complete provided nutritional and psychosocial assessment questionnaire before the next appointment. Will be scheduled for indirect calorimetry to determine resting energy expenditure in a fasting state.  This will allow us  to create a reduced calorie, high-protein meal plan to promote loss of fat mass while preserving muscle mass. Counseled on the health benefits of losing 5%-15% of total body weight. Was counseled on nutritional approaches to weight loss and benefits of reducing processed foods and consuming plant-based foods and high quality protein as part of nutritional weight management. Was counseled on pharmacotherapy and role as an adjunct in weight management.   Obesity Education Performed Today:  She was weighed on the bioimpedance scale and results were discussed and documented in the synopsis.  We discussed obesity as a disease and the importance of a more detailed evaluation of all the factors contributing to the disease.  We discussed the importance of long term lifestyle changes which include nutrition, exercise and behavioral modifications as well as the importance of customizing this to her specific health and social needs.  We discussed the benefits of reaching a healthier weight to alleviate the symptoms of existing conditions and reduce the risks of the biomechanical, metabolic and psychological effects of obesity.  Jessia A. Vultaggio appears to be in the action stage of change and states they are ready to start intensive lifestyle modifications and behavioral  modifications.  30 minutes was spent today on this visit including the above counseling, pre-visit chart review, and post-visit documentation.  Reviewed by clinician on day of visit: allergies, medications, problem list, medical history, surgical history, family history, social history, and previous encounter notes pertinent to obesity diagnosis.    Corean SAUNDERS Preslei Blakley FNP-C

## 2024-01-08 ENCOUNTER — Encounter: Payer: Self-pay | Admitting: Bariatrics

## 2024-01-08 ENCOUNTER — Ambulatory Visit: Admitting: Bariatrics

## 2024-01-08 VITALS — BP 130/85 | HR 85 | Ht 63.0 in | Wt 174.0 lb

## 2024-01-08 DIAGNOSIS — E6609 Other obesity due to excess calories: Secondary | ICD-10-CM

## 2024-01-08 DIAGNOSIS — E559 Vitamin D deficiency, unspecified: Secondary | ICD-10-CM | POA: Diagnosis not present

## 2024-01-08 DIAGNOSIS — R5383 Other fatigue: Secondary | ICD-10-CM | POA: Diagnosis not present

## 2024-01-08 DIAGNOSIS — Z6830 Body mass index (BMI) 30.0-30.9, adult: Secondary | ICD-10-CM

## 2024-01-08 DIAGNOSIS — Z1331 Encounter for screening for depression: Secondary | ICD-10-CM

## 2024-01-08 DIAGNOSIS — E785 Hyperlipidemia, unspecified: Secondary | ICD-10-CM

## 2024-01-08 DIAGNOSIS — Z0000 Encounter for general adult medical examination without abnormal findings: Secondary | ICD-10-CM

## 2024-01-08 DIAGNOSIS — E669 Obesity, unspecified: Secondary | ICD-10-CM | POA: Diagnosis not present

## 2024-01-08 DIAGNOSIS — R0602 Shortness of breath: Secondary | ICD-10-CM

## 2024-01-08 DIAGNOSIS — E7849 Other hyperlipidemia: Secondary | ICD-10-CM

## 2024-01-08 NOTE — Progress Notes (Unsigned)
 At a Glance:  Vitals BP: 130/85 Pulse Rate: 85 SpO2: 97 %   Anthropometric Measurements Height: 5' 3 (1.6 m) Weight: 174 lb (78.9 kg) BMI (Calculated): 30.83 Starting Weight: 174lb Peak Weight: 178lb Waist Measurement : 39 inches   Body Composition  Body Fat %: 39.4 % Fat Mass (lbs): 68.8 lbs Muscle Mass (lbs): 100.4 lbs Total Body Water (lbs): 68.2 lbs Visceral Fat Rating : 10   Other Clinical Data RMR: 1483 Fasting: yes Labs: yes Today's Visit #: 1 Starting Date: 01/08/24    EKG: Normal sinus rhythm, rate 80.   Indirect Calorimeter:   Resting Metabolic Rate ( RMR):  RMR (actual): 1483 kcal RMR (calculated): 1454 kcal The calculated basal metabolic rate is 8545 kcal thus her basal metabolic rate is essentially the same as expected.  Plan:   Indirect calorimeter completed, interpreted and reviewed with patient today and allowed to ask questions.  Discussed the implications for the chosen plan and exercise based on the RMR reading.  Will consider repeating the RMR in the future based on weight loss.    Chief Complaint:  Obesity   Subjective:  Anna Ray (MR# [id number]) is a 58 y.o. female who presents for evaluation and treatment of obesity and related comorbidities.   Anna Ray is currently in the action stage of change and ready to dedicate time achieving and maintaining a healthier weight. Anna Ray is interested in becoming our patient and working on intensive lifestyle modifications including (but not limited to) diet and exercise for weight loss.  Anna Ray has been struggling with her weight. She has been unsuccessful in either losing weight, maintaining weight loss, or reaching her healthy weight goal.  Anna Ray's habits were reviewed today and are as follows: Her family eats meals together, she thinks her family will eat healthier with her, she started gaining weight during the Covid pandemic, and she has problems with excessive  hunger.  Current or previous pharmacotherapy: Phentermine   Response to medication: Had side effects so it was discontinued (palpitations).   Other Fatigue Anna Ray denies daytime somnolence and denies waking up still tired.Anna Ray generally gets 6 or 7 hours of sleep per night, and states that she has generally restful sleep. Snoring is not present. Apneic episodes are not present. Epworth Sleepiness Score is 7.   Shortness of Breath Anna Ray notes increasing shortness of breath with exercising and seems to be worsening over time with weight gain. She notes getting out of breath sooner with activity than she used to. This has not gotten worse recently. Anna Ray denies shortness of breath at rest or orthopnea.  Depression Screen Anna Ray's Food and Mood (modified PHQ-9) score was 7. 5-9 mild depression     01/08/2024    7:12 AM  Depression screen PHQ 2/9  Decreased Interest 0  Down, Depressed, Hopeless 0  PHQ - 2 Score 0  Altered sleeping 1  Tired, decreased energy 0  Change in appetite 1  Feeling bad or failure about yourself  0  Trouble concentrating 0  Moving slowly or fidgety/restless 0  Suicidal thoughts 0  PHQ-9 Score 2  Difficult doing work/chores Not difficult at all     Assessment and Plan:   Other Fatigue Anna Ray does not feel that her weight is causing her energy to be lower than it should be. Fatigue may be related to obesity, depression or many other causes. Labs will be ordered, and in the meanwhile, Anna Ray will focus on self care including making healthy food choices, increasing physical activity and  focusing on stress reduction.  Shortness of Breath Anna Ray does not feel that she gets out of breath more easily that she used to when she exercises. Anna Ray's shortness of breath appears to be obesity related and exercise induced. She has agreed to work on weight loss and gradually increase exercise to treat her exercise induced shortness of breath. Will continue to  monitor closely.  Health Maintenance:   Obesity   Plan: Will do EKG, indirect calorimetry, and labs.     Vitamin D  Deficiency Vitamin D  is not at goal of 50.  Most recent vitamin D  level was 37 (distant value). She is at risk for vitamin D  deficiency due to obesity.  She is on OTC vitamin D3 2000 IU daily and MV Lab Results  Component Value Date   VD25OH 39 10/18/2015   VD25OH 46 11/24/2014   VD25OH 59 10/23/2012    Plan: Will check for vitamin D  deficiency.   Hyperlipidemia LDL is not at goal. Medication(s): no medications Cardiovascular risk factors: obesity (BMI >= 30 kg/m2) and sedentary lifestyle  Lab Results  Component Value Date   CHOL 195 11/10/2023   HDL 64 11/10/2023   LDLCALC 105 (H) 11/10/2023   TRIG 152 (H) 11/10/2023   CHOLHDL 2.8 11/07/2022   Lab Results  Component Value Date   ALT 16 11/10/2023   AST 19 11/10/2023   ALKPHOS 137 (H) 11/10/2023   BILITOT 0.5 11/10/2023   The 10-year ASCVD risk score (Arnett DK, et al., 2019) is: 1.9%   Values used to calculate the score:     Age: 62 years     Clinically relevant sex: Female     Is Non-Hispanic African American: No     Diabetic: No     Tobacco smoker: No     Systolic Blood Pressure: 130 mmHg     Is BP treated: No     HDL Cholesterol: 64 mg/dL     Total Cholesterol: 195 mg/dL  Plan:  Will avoid all trans fats.  Will read labels Will minimize saturated fats except the following: low fat meats in moderation, diary, and limited dark chocolate.     Anna Ray had a positive depression screening. Depression is commonly associated with obesity and often results in emotional eating behaviors. We will monitor this closely and work on CBT to help improve the non-hunger eating patterns. Referral to Psychology may be required if no improvement is seen as she continues in our clinic.   Previous labs reviewed today. Date: 11/10/23 CMP, Lipids, and CBC  Labs done today CMP, A1c, insulin, lipids, thyroid   panel, B12 and vitamin D .    Generalized Obesity: BMI (Calculated): 30.83   Anna Ray is currently in the action stage of change and her goal is to begin weight loss efforts. I recommend Anna Ray begin the structured treatment plan as follows:  She has agreed to Category 2 Plan, Pescatarian Plan, and more vegan and vegetarian options.   Exercise goals: All adults should avoid inactivity. Some activity is better than none, and adults who participate in any amount of physical activity, gain some health benefits.  Behavioral modification strategies:increasing lean protein intake, decreasing simple carbohydrates, increasing vegetables, increase high fiber foods, no skipping meals, meal planning and cooking strategies, keeping healthy foods in the home, better snacking choices, avoiding temptations, and planning for success  She was informed of the importance of frequent follow-up visits to maximize her success with intensive lifestyle modifications for her multiple health conditions. She was informed we would  discuss her lab results at her next visit unless there is a critical issue that needs to be addressed sooner. Anna Ray agreed to keep her next visit at the agreed upon time to discuss these results.  Objective:  General: Cooperative, alert, well developed, in no acute distress. HEENT: Conjunctivae and lids unremarkable. Cardiovascular: Regular rhythm.  Lungs: Normal work of breathing. Neurologic: No focal deficits.   Lab Results  Component Value Date   CREATININE 0.70 11/10/2023   BUN 11 11/10/2023   NA 139 11/10/2023   K 4.3 11/10/2023   CL 100 11/10/2023   CO2 24 11/10/2023   Lab Results  Component Value Date   ALT 16 11/10/2023   AST 19 11/10/2023   ALKPHOS 137 (H) 11/10/2023   BILITOT 0.5 11/10/2023   Lab Results  Component Value Date   HGBA1C 5.6 06/29/2013   HGBA1C 5.6 10/23/2012   HGBA1C 5.5 08/18/2009   No results found for: INSULIN Lab Results  Component Value  Date   TSH 2.720 11/10/2023   Lab Results  Component Value Date   CHOL 195 11/10/2023   HDL 64 11/10/2023   LDLCALC 105 (H) 11/10/2023   TRIG 152 (H) 11/10/2023   CHOLHDL 2.8 11/07/2022   Lab Results  Component Value Date   WBC 4.9 11/10/2023   HGB 14.7 11/10/2023   HCT 44.8 11/10/2023   MCV 96 11/10/2023   PLT 274 11/10/2023   Lab Results  Component Value Date   FERRITIN 100 09/27/2016    Attestation Statements:  Applicable history such as the following:  allergies, medications, problem list, medical history, surgical history, family history, social history, and previous encounter notes reviewed by clinician on day of visit:  Time spent on visit in care of the patient today including the items listed below was *** minutes.    {AABTime:32652} minutes were spent talking about the history, {aabtime2025:32653} minutes for face to face counseling implementing the plan, discussing the specifics of how to arrange meals, meal planning, water intake.   I spent face to face time discussing his/her plan, including breakfast, additional breakfast options, lunch, and dinner options, grocery list, and snacks.  I reviewed her indirect calorimetry. I discussed the implications for the diet plan.    Discussed the bio-impedence test (fat %, muscle mass, and water weight) and allowed the patient to ask questions.   Discussed the following information sheets: {aablists:32658}.   I reviewed the labs which were ordered from her visit on ***,   I additionally spent time documenting, reviewing, and checking the codes before submitting.   This may have been prepared with the assistance of Engineer, Civil (consulting).  Occasional wrong-word or sound-a-like substitutions may have occurred due to the inherent limitations of voice recognition software.    Clayborne Daring, DO

## 2024-01-09 ENCOUNTER — Encounter: Payer: Self-pay | Admitting: Obstetrics & Gynecology

## 2024-01-09 DIAGNOSIS — M8580 Other specified disorders of bone density and structure, unspecified site: Secondary | ICD-10-CM | POA: Insufficient documentation

## 2024-01-09 LAB — COMPREHENSIVE METABOLIC PANEL WITH GFR
ALT: 35 IU/L — ABNORMAL HIGH (ref 0–32)
AST: 26 IU/L (ref 0–40)
Albumin: 4.7 g/dL (ref 3.8–4.9)
Alkaline Phosphatase: 144 IU/L — ABNORMAL HIGH (ref 49–135)
BUN/Creatinine Ratio: 20 (ref 9–23)
BUN: 13 mg/dL (ref 6–24)
Bilirubin Total: 0.5 mg/dL (ref 0.0–1.2)
CO2: 25 mmol/L (ref 20–29)
Calcium: 9.6 mg/dL (ref 8.7–10.2)
Chloride: 99 mmol/L (ref 96–106)
Creatinine, Ser: 0.64 mg/dL (ref 0.57–1.00)
Globulin, Total: 2.4 g/dL (ref 1.5–4.5)
Glucose: 87 mg/dL (ref 70–99)
Potassium: 4.5 mmol/L (ref 3.5–5.2)
Sodium: 139 mmol/L (ref 134–144)
Total Protein: 7.1 g/dL (ref 6.0–8.5)
eGFR: 104 mL/min/1.73

## 2024-01-09 LAB — LIPID PANEL WITH LDL/HDL RATIO
Cholesterol, Total: 219 mg/dL — ABNORMAL HIGH (ref 100–199)
HDL: 80 mg/dL
LDL Chol Calc (NIH): 121 mg/dL — ABNORMAL HIGH (ref 0–99)
LDL/HDL Ratio: 1.5 ratio (ref 0.0–3.2)
Triglycerides: 105 mg/dL (ref 0–149)
VLDL Cholesterol Cal: 18 mg/dL (ref 5–40)

## 2024-01-09 LAB — VITAMIN D 25 HYDROXY (VIT D DEFICIENCY, FRACTURES): Vit D, 25-Hydroxy: 115 ng/mL — ABNORMAL HIGH (ref 30.0–100.0)

## 2024-01-09 LAB — TSH+T4F+T3FREE
Free T4: 1.17 ng/dL (ref 0.82–1.77)
T3, Free: 3.4 pg/mL (ref 2.0–4.4)
TSH: 1.51 u[IU]/mL (ref 0.450–4.500)

## 2024-01-09 LAB — INSULIN, RANDOM: INSULIN: 7.9 u[IU]/mL (ref 2.6–24.9)

## 2024-01-09 LAB — VITAMIN B12: Vitamin B-12: 1176 pg/mL (ref 232–1245)

## 2024-01-09 LAB — HEMOGLOBIN A1C
Est. average glucose Bld gHb Est-mCnc: 117 mg/dL
Hgb A1c MFr Bld: 5.7 % — ABNORMAL HIGH (ref 4.8–5.6)

## 2024-01-12 ENCOUNTER — Ambulatory Visit: Payer: Self-pay

## 2024-01-12 ENCOUNTER — Encounter (INDEPENDENT_AMBULATORY_CARE_PROVIDER_SITE_OTHER): Payer: Self-pay | Admitting: Bariatrics

## 2024-01-12 DIAGNOSIS — E559 Vitamin D deficiency, unspecified: Secondary | ICD-10-CM | POA: Insufficient documentation

## 2024-01-12 DIAGNOSIS — R7401 Elevation of levels of liver transaminase levels: Secondary | ICD-10-CM | POA: Insufficient documentation

## 2024-01-12 DIAGNOSIS — R7303 Prediabetes: Secondary | ICD-10-CM | POA: Insufficient documentation

## 2024-01-12 DIAGNOSIS — E7800 Pure hypercholesterolemia, unspecified: Secondary | ICD-10-CM | POA: Insufficient documentation

## 2024-01-12 NOTE — Telephone Encounter (Signed)
 Left message for patient to return call.

## 2024-01-12 NOTE — Telephone Encounter (Signed)
-----   Message from Clayborne Daring, DO sent at 01/12/2024  1:37 PM EST ----- Call pt. Her vitamin D  is too high at 115. Ask her to stop and we can recheck in the future.

## 2024-01-28 ENCOUNTER — Telehealth: Admitting: Nurse Practitioner

## 2024-01-28 DIAGNOSIS — J40 Bronchitis, not specified as acute or chronic: Secondary | ICD-10-CM

## 2024-01-28 MED ORDER — BENZONATATE 100 MG PO CAPS: 100.0000 mg | ORAL_CAPSULE | Freq: Three times a day (TID) | ORAL | 0 refills | Status: AC | PRN

## 2024-01-28 MED ORDER — ALBUTEROL SULFATE HFA 108 (90 BASE) MCG/ACT IN AERS: 2.0000 | INHALATION_SPRAY | Freq: Four times a day (QID) | RESPIRATORY_TRACT | 0 refills | Status: AC | PRN

## 2024-01-28 MED ORDER — AZITHROMYCIN 250 MG PO TABS: ORAL_TABLET | ORAL | 0 refills | Status: AC

## 2024-01-28 NOTE — Progress Notes (Signed)
 We are sorry that you are not feeling well.  Here is how we plan to help!  Based on your presentation I believe you most likely have A cough due to bacteria.  When patients have a fever and a productive cough with a change in color or increased sputum production, we are concerned about bacterial bronchitis.  If left untreated it can progress to pneumonia.  If your symptoms do not improve with your treatment plan it is important that you contact your provider.   I have prescribed Azithromyin 250 mg: two tablets now and then one tablet daily for 4 additonal days    In addition you may use A prescription cough medication called Tessalon  Perles 100mg . You may take 1-2 capsules every 8 hours as needed for your cough.  We will also prescribe an inhaler to use as needed for wheezing  Meds ordered this encounter  Medications   azithromycin  (ZITHROMAX ) 250 MG tablet    Sig: Take 2 tablets on day 1, then 1 tablet daily on days 2 through 5    Dispense:  6 tablet    Refill:  0   albuterol  (VENTOLIN  HFA) 108 (90 Base) MCG/ACT inhaler    Sig: Inhale 2 puffs into the lungs every 6 (six) hours as needed for wheezing or shortness of breath.    Dispense:  8 g    Refill:  0   benzonatate  (TESSALON ) 100 MG capsule    Sig: Take 1 capsule (100 mg total) by mouth 3 (three) times daily as needed.    Dispense:  30 capsule    Refill:  0     From your responses in the eVisit questionnaire you describe inflammation in the upper respiratory tract which is causing a significant cough.  This is commonly called Bronchitis and has four common causes:   Allergies Viral Infections Acid Reflux Bacterial Infection Allergies, viruses and acid reflux are treated by controlling symptoms or eliminating the cause. An example might be a cough caused by taking certain blood pressure medications. You stop the cough by changing the medication. Another example might be a cough caused by acid reflux. Controlling the reflux helps  control the cough.  USE OF BRONCHODILATOR (RESCUE) INHALERS: There is a risk from using your bronchodilator too frequently.  The risk is that over-reliance on a medication which only relaxes the muscles surrounding the breathing tubes can reduce the effectiveness of medications prescribed to reduce swelling and congestion of the tubes themselves.  Although you feel brief relief from the bronchodilator inhaler, your asthma may actually be worsening with the tubes becoming more swollen and filled with mucus.  This can delay other crucial treatments, such as oral steroid medications. If you need to use a bronchodilator inhaler daily, several times per day, you should discuss this with your provider.  There are probably better treatments that could be used to keep your asthma under control.     HOME CARE Only take medications as instructed by your medical team. Complete the entire course of an antibiotic. Drink plenty of fluids and get plenty of rest. Avoid close contacts especially the very young and the elderly Cover your mouth if you cough or cough into your sleeve. Always remember to wash your hands A steam or ultrasonic humidifier can help congestion.   GET HELP RIGHT AWAY IF: You develop worsening fever. You become short of breath You cough up blood. Your symptoms persist after you have completed your treatment plan MAKE SURE YOU  Understand  these instructions. Will watch your condition. Will get help right away if you are not doing well or get worse.  Your e-visit answers were reviewed by a board certified advanced clinical practitioner to complete your personal care plan.  Depending on the condition, your plan could have included both over the counter or prescription medications. If there is a problem please reply  once you have received a response from your provider. Your safety is important to us .  If you have drug allergies check your prescription carefully.    You can use  MyChart to ask questions about todays visit, request a non-urgent call back, or ask for a work or school excuse for 24 hours related to this e-Visit. If it has been greater than 24 hours you will need to follow up with your provider, or enter a new e-Visit to address those concerns. You will get an e-mail in the next two days asking about your experience.  I hope that your e-visit has been valuable and will speed your recovery. Thank you for using e-visits.   I have spent 5 minutes in review of e-visit questionnaire, review and updating patient chart, medical decision making and response to patient.   Lauraine Kitty, FNP

## 2024-01-29 ENCOUNTER — Ambulatory Visit: Admitting: Bariatrics

## 2024-02-10 ENCOUNTER — Ambulatory Visit: Admitting: Bariatrics

## 2024-02-10 ENCOUNTER — Encounter: Payer: Self-pay | Admitting: Bariatrics

## 2024-02-10 VITALS — BP 127/88 | HR 84 | Ht 63.0 in | Wt 173.0 lb

## 2024-02-10 DIAGNOSIS — E6609 Other obesity due to excess calories: Secondary | ICD-10-CM

## 2024-02-10 DIAGNOSIS — Z6830 Body mass index (BMI) 30.0-30.9, adult: Secondary | ICD-10-CM

## 2024-02-10 DIAGNOSIS — R7303 Prediabetes: Secondary | ICD-10-CM

## 2024-02-10 DIAGNOSIS — E559 Vitamin D deficiency, unspecified: Secondary | ICD-10-CM | POA: Diagnosis not present

## 2024-02-10 DIAGNOSIS — E669 Obesity, unspecified: Secondary | ICD-10-CM | POA: Diagnosis not present

## 2024-02-10 NOTE — Progress Notes (Signed)
 "                                                                                                                        First follow-up after initial visit.        WEIGHT SUMMARY AND BIOMETRICS  Weight Lost Since Last Visit: 1lb  Weight Gained Since Last Visit: 0   Vitals BP: 127/88 Pulse Rate: 84 SpO2: 97 %   Anthropometric Measurements Height: 5' 3 (1.6 m) Weight: 173 lb (78.5 kg) BMI (Calculated): 30.65 Weight at Last Visit: 174lb Weight Lost Since Last Visit: 1lb Weight Gained Since Last Visit: 0 Starting Weight: 174lb Total Weight Loss (lbs): 1 lb (0.454 kg) Peak Weight: 178lb   Body Composition  Body Fat %: 39.9 % Fat Mass (lbs): 69.2 lbs Muscle Mass (lbs): 99 lbs Total Body Water (lbs): 69 lbs Visceral Fat Rating : 10   Other Clinical Data Fasting: no Labs: no Today's Visit #: 2 Starting Date: 01/08/24    OBESITY Anna Ray is here to discuss her progress with her obesity treatment plan along with follow-up of her obesity related diagnoses.    Nutrition Plan: the Category 2 plan, the pescatarian plan, and Other more vegan - 0% adherence.  Current exercise: none  Interim History:  She is down 1 lb since her last visit. She was sick during the first of the year (bronchitis). She took the calorie and protein goal.  Protein intake is as prescribed, Is not skipping meals, Journaling consistently., and Water intake is adequate.  Initial positives regarding the dietary plan: She has been tracking her meals using my fitness pal. Initial challenges regarding  the dietary plan: She does not like eggs or diary.   Pharmacotherapy: Anna Ray is not on any anti-obesity medications.  Hunger is moderately controlled.  Cravings are moderately controlled.  Assessment/Plan:   Prediabetes Last A1c was 5.7  Medication(s): none Lab Results  Component Value Date   HGBA1C 5.7 (H) 01/08/2024   HGBA1C 5.6 06/29/2013   HGBA1C 5.6 10/23/2012   HGBA1C 5.5 08/18/2009    Lab Results  Component Value Date   INSULIN  7.9 01/08/2024    Plan: Information sheet on  Insulin  Resistance and Prediabetes.  Will minimize all refined carbohydrates both sweets and starches.  Will work on the plan and exercise.  Consider both aerobic and resistance training.  Will keep protein, water, and fiber intake high.  Increase Polyunsaturated and Monounsaturated fats to increase satiety and encourage weight loss.  Aim for 7 to 9 hours of sleep nightly.  She will continue to use the app my fitness pal and will journal her meals.  She will minimize eating out.  She will not focus on carbohydrates but will focus on carbohydrates exclusively but will focus on eating nonstarchy vegetables, low sugar fruits and healthy carbs.  Vitamin D  Deficiency Vitamin D  exceeds the goal.   Most recent vitamin D  level was 115. She is on OTC vitamin D3 5000 IU daily. She states  that she had been on vitamin D  10,000 international units and also a calcium supplement with vitamin D .  Lab Results  Component Value Date   VD25OH 115.0 (H) 01/08/2024   VD25OH 39 10/18/2015   VD25OH 46 11/24/2014    Plan: Recheck her labs in approximately 3 months She will continue to take her over-the-counter vitamin D  at 5000 units daily.     Generalized Obesity: Current BMI BMI (Calculated): 30.65   Pharmacotherapy Plan No anti-obesity medications.  Anna Ray is currently in the action stage of change. As such, her goal is to continue with weight loss efforts.  She has agreed to the Category 2 plan, the pescatarian plan, and Other and more vegan options. .  She will use her my fitness pal for certain recipes that meet this criteria and also can use AI as needed.   Exercise goals: All adults should avoid inactivity. Some physical activity is better than none, and adults who participate in any amount of physical activity gain some health benefits.  Behavioral modification strategies: increasing lean  protein intake, decreasing simple carbohydrates , no meal skipping, decrease eating out, meal planning , increase water intake, better snacking choices, planning for success, increasing vegetables, increasing fiber rich foods, keep healthy foods in the home, increase frequency of journaling, weigh protein portions, measure portion sizes, work on smaller portions, pack lunch for work, and mindful eating.  Anna Ray has agreed to follow-up with our clinic in 4 weeks.    Labs reviewed today from last visit (CMP, Lipids, HgbA1c, insulin , vitamin D , B 12, and thyroid  panel).   Objective:   VITALS: Per patient if applicable, see vitals. GENERAL: Alert and in no acute distress. CARDIOPULMONARY: No increased WOB. Speaking in clear sentences.  PSYCH: Pleasant and cooperative. Speech normal rate and rhythm. Affect is appropriate. Insight and judgement are appropriate. Attention is focused, linear, and appropriate.  NEURO: Oriented as arrived to appointment on time with no prompting.   Attestation Statements:   This was prepared with the assistance of Engineer, Civil (consulting).  Occasional wrong-word or sound-a-like substitutions may have occurred due to the inherent limitations of voice recognition software.   Anna Daring, DO   "

## 2024-03-15 ENCOUNTER — Encounter: Admitting: Family Medicine

## 2024-03-25 ENCOUNTER — Ambulatory Visit: Admitting: Bariatrics
